# Patient Record
Sex: Female | Born: 1975 | Race: White | Hispanic: No | Marital: Single | Smoking: Current every day smoker
Health system: Southern US, Community
[De-identification: ages and names within clinical notes are randomized; demographics above are authoritative.]

## PROBLEM LIST (undated history)

## (undated) DIAGNOSIS — F419 Anxiety disorder, unspecified: Secondary | ICD-10-CM

## (undated) DIAGNOSIS — F32A Depression, unspecified: Secondary | ICD-10-CM

## (undated) DIAGNOSIS — IMO0001 Reserved for inherently not codable concepts without codable children: Secondary | ICD-10-CM

## (undated) DIAGNOSIS — J45909 Unspecified asthma, uncomplicated: Secondary | ICD-10-CM

## (undated) DIAGNOSIS — K219 Gastro-esophageal reflux disease without esophagitis: Secondary | ICD-10-CM

## (undated) DIAGNOSIS — R519 Headache, unspecified: Secondary | ICD-10-CM

## (undated) DIAGNOSIS — G4730 Sleep apnea, unspecified: Secondary | ICD-10-CM

## (undated) DIAGNOSIS — F329 Major depressive disorder, single episode, unspecified: Secondary | ICD-10-CM

## (undated) DIAGNOSIS — J449 Chronic obstructive pulmonary disease, unspecified: Secondary | ICD-10-CM

## (undated) DIAGNOSIS — R51 Headache: Secondary | ICD-10-CM

## (undated) HISTORY — PX: OTHER SURGICAL HISTORY: SHX169

## (undated) HISTORY — PX: ABDOMINAL HYSTERECTOMY: SHX81

---

## 1997-07-26 HISTORY — PX: TUBAL LIGATION: SHX77

## 2005-03-11 ENCOUNTER — Emergency Department: Payer: Self-pay | Admitting: Emergency Medicine

## 2005-03-11 ENCOUNTER — Other Ambulatory Visit: Payer: Self-pay

## 2006-07-12 ENCOUNTER — Emergency Department: Payer: Self-pay | Admitting: Emergency Medicine

## 2006-08-31 ENCOUNTER — Emergency Department: Payer: Self-pay | Admitting: Unknown Physician Specialty

## 2006-08-31 ENCOUNTER — Other Ambulatory Visit: Payer: Self-pay

## 2007-04-16 ENCOUNTER — Emergency Department: Payer: Self-pay | Admitting: Emergency Medicine

## 2009-02-26 ENCOUNTER — Ambulatory Visit: Payer: Self-pay | Admitting: Internal Medicine

## 2009-05-08 ENCOUNTER — Ambulatory Visit: Payer: Self-pay | Admitting: Gastroenterology

## 2009-07-28 ENCOUNTER — Emergency Department: Payer: Self-pay | Admitting: Emergency Medicine

## 2009-09-16 ENCOUNTER — Ambulatory Visit: Payer: Self-pay | Admitting: Internal Medicine

## 2009-11-19 ENCOUNTER — Ambulatory Visit: Payer: Self-pay | Admitting: Internal Medicine

## 2010-04-14 ENCOUNTER — Ambulatory Visit: Payer: Self-pay | Admitting: Internal Medicine

## 2010-04-15 ENCOUNTER — Ambulatory Visit: Payer: Self-pay | Admitting: Internal Medicine

## 2010-05-13 ENCOUNTER — Ambulatory Visit: Payer: Self-pay | Admitting: Gastroenterology

## 2010-05-29 ENCOUNTER — Ambulatory Visit: Payer: Self-pay

## 2011-01-21 ENCOUNTER — Emergency Department: Payer: Self-pay | Admitting: Emergency Medicine

## 2011-02-11 ENCOUNTER — Ambulatory Visit: Payer: Self-pay | Admitting: Internal Medicine

## 2011-03-25 ENCOUNTER — Ambulatory Visit: Payer: Self-pay | Admitting: Obstetrics & Gynecology

## 2011-03-27 ENCOUNTER — Emergency Department: Payer: Self-pay | Admitting: Emergency Medicine

## 2011-04-02 ENCOUNTER — Ambulatory Visit: Payer: Self-pay | Admitting: Obstetrics & Gynecology

## 2011-08-17 ENCOUNTER — Ambulatory Visit: Payer: Self-pay | Admitting: Internal Medicine

## 2012-02-17 ENCOUNTER — Ambulatory Visit: Payer: Self-pay

## 2012-08-03 ENCOUNTER — Emergency Department: Payer: Self-pay | Admitting: Unknown Physician Specialty

## 2012-08-03 LAB — CBC
HCT: 38.1 % (ref 35.0–47.0)
HGB: 13.1 g/dL (ref 12.0–16.0)
MCH: 32.8 pg (ref 26.0–34.0)
MCHC: 34.4 g/dL (ref 32.0–36.0)
MCV: 95 fL (ref 80–100)
Platelet: 234 10*3/uL (ref 150–440)
RBC: 4 10*6/uL (ref 3.80–5.20)

## 2012-08-03 LAB — BASIC METABOLIC PANEL
Anion Gap: 4 — ABNORMAL LOW (ref 7–16)
Calcium, Total: 8.9 mg/dL (ref 8.5–10.1)
Chloride: 103 mmol/L (ref 98–107)
Creatinine: 0.74 mg/dL (ref 0.60–1.30)
EGFR (African American): >60
EGFR (Non-African Amer.): >60
Osmolality: 275 (ref 275–301)
Potassium: 3.6 mmol/L (ref 3.5–5.1)
Sodium: 138 mmol/L (ref 136–145)

## 2012-08-11 ENCOUNTER — Ambulatory Visit: Payer: Self-pay | Admitting: Internal Medicine

## 2012-08-15 ENCOUNTER — Ambulatory Visit: Payer: Self-pay | Admitting: Physician Assistant

## 2013-10-15 ENCOUNTER — Ambulatory Visit: Payer: Self-pay

## 2013-10-23 ENCOUNTER — Ambulatory Visit: Payer: Self-pay | Admitting: Internal Medicine

## 2013-11-28 ENCOUNTER — Ambulatory Visit: Payer: Self-pay | Admitting: Internal Medicine

## 2013-11-29 ENCOUNTER — Ambulatory Visit: Payer: Self-pay | Admitting: Internal Medicine

## 2014-01-05 ENCOUNTER — Emergency Department: Payer: Self-pay | Admitting: Emergency Medicine

## 2014-04-11 ENCOUNTER — Ambulatory Visit: Payer: Self-pay

## 2014-04-30 ENCOUNTER — Ambulatory Visit: Payer: Self-pay | Admitting: Internal Medicine

## 2014-05-02 LAB — BRONCHIAL WASH CULTURE

## 2014-05-14 ENCOUNTER — Ambulatory Visit: Payer: Self-pay | Admitting: Internal Medicine

## 2014-06-03 LAB — CULTURE, FUNGUS WITHOUT SMEAR

## 2014-08-28 ENCOUNTER — Ambulatory Visit: Payer: Self-pay | Admitting: Anesthesiology

## 2014-08-28 LAB — BASIC METABOLIC PANEL
ANION GAP: 7 (ref 7–16)
BUN: 8 mg/dL (ref 7–18)
CALCIUM: 8.5 mg/dL (ref 8.5–10.1)
Chloride: 107 mmol/L (ref 98–107)
Co2: 26 mmol/L (ref 21–32)
Creatinine: 0.73 mg/dL (ref 0.60–1.30)
EGFR (African American): >60
EGFR (Non-African Amer.): >60
GLUCOSE: 77 mg/dL (ref 65–99)
OSMOLALITY: 277 (ref 275–301)
Potassium: 3.4 mmol/L — ABNORMAL LOW (ref 3.5–5.1)
Sodium: 140 mmol/L (ref 136–145)

## 2014-08-28 LAB — CBC WITH DIFFERENTIAL/PLATELET
BASOS PCT: 0.8 %
Basophil #: 0.1 10*3/uL (ref 0.0–0.1)
EOS PCT: 0.7 %
Eosinophil #: 0.1 10*3/uL (ref 0.0–0.7)
HCT: 36.1 % (ref 35.0–47.0)
HGB: 12 g/dL (ref 12.0–16.0)
LYMPHS ABS: 1.8 10*3/uL (ref 1.0–3.6)
Lymphocyte %: 14 %
MCH: 31.4 pg (ref 26.0–34.0)
MCHC: 33.3 g/dL (ref 32.0–36.0)
MCV: 94 fL (ref 80–100)
MONOS PCT: 6 %
Monocyte #: 0.8 x10 3/mm (ref 0.2–0.9)
NEUTROS ABS: 10 10*3/uL — AB (ref 1.4–6.5)
Neutrophil %: 78.5 %
Platelet: 232 10*3/uL (ref 150–440)
RBC: 3.84 10*6/uL (ref 3.80–5.20)
RDW: 12.7 % (ref 11.5–14.5)
WBC: 12.7 10*3/uL — AB (ref 3.6–11.0)

## 2014-09-10 ENCOUNTER — Ambulatory Visit: Payer: Self-pay | Admitting: Specialist

## 2014-10-02 ENCOUNTER — Emergency Department: Payer: Self-pay | Admitting: Emergency Medicine

## 2014-11-18 LAB — SURGICAL PATHOLOGY

## 2014-11-24 NOTE — Op Note (Signed)
PATIENT NAME:  Joan Bass, Joan Bass MR#:  [id number] DATE OF BIRTH:  October 10, 1975  DATE OF PROCEDURE:  09/10/2014  PREOPERATIVE DIAGNOSIS: Painful left olecranon bursa.   POSTOPERATIVE DIAGNOSIS: Painful left olecranon bursa.   PROCEDURE: Excision of left elbow olecranon bursa.   SURGEON: Lucas Mallow, MD   ANESTHESIA: General.   COMPLICATIONS: None.   TOURNIQUET TIME: Approximately 50 minutes.   DESCRIPTION OF PROCEDURE: After adequate induction of general anesthesia, the left upper extremity is thoroughly prepped with alcohol and ChloraPrep and draped in standard sterile fashion. The extremity is wrapped out with the Esmarch bandage and pneumatic tourniquet elevated to 250 mmHg. A curved lateral incision is made over the bursa, and the dissection is carried down to the bursa sac. This is completely dissected out without removing the fluid. Careful respect is paid to the ulnar nerve medially. The complete bursa sac is removed under direct vision. The rongeur is used to remove any extra tissue, which is suspicious. The wound is thoroughly irrigated multiple times. Two large vessel loop drains are brought out through separate stab wound incisions. Soft bulky dressing is applied, along with a sling. The patient is returned to the recovery room in satisfactory condition, having tolerated the procedure quite well.    ____________________________ Lucas Mallow, MD ces:mw D: 09/10/2014 10:04:08 ET T: 09/10/2014 11:23:46 ET JOB#: [id number]  cc: Lucas Mallow, MD, <Dictator> Lucas Mallow MD ELECTRONICALLY SIGNED 09/10/2014 16:55

## 2014-12-13 ENCOUNTER — Ambulatory Visit: Payer: Medicaid Other | Admitting: Anesthesiology

## 2014-12-13 ENCOUNTER — Encounter
Admission: RE | Disposition: A | Discharge status: Home / Self Care | Payer: Self-pay | Source: Ambulatory Visit | Attending: Gastroenterology

## 2014-12-13 ENCOUNTER — Encounter: Payer: Self-pay | Admitting: *Deleted

## 2014-12-13 ENCOUNTER — Ambulatory Visit
Admission: RE | Admit: 2014-12-13 | Discharge: 2014-12-13 | Disposition: A | Discharge status: Home / Self Care | Payer: Medicaid Other | Source: Ambulatory Visit | Attending: Gastroenterology | Admitting: Gastroenterology

## 2014-12-13 DIAGNOSIS — Z8371 Family history of colonic polyps: Secondary | ICD-10-CM | POA: Diagnosis present

## 2014-12-13 DIAGNOSIS — F329 Major depressive disorder, single episode, unspecified: Secondary | ICD-10-CM | POA: Insufficient documentation

## 2014-12-13 DIAGNOSIS — K5909 Other constipation: Secondary | ICD-10-CM | POA: Insufficient documentation

## 2014-12-13 DIAGNOSIS — Z7951 Long term (current) use of inhaled steroids: Secondary | ICD-10-CM | POA: Insufficient documentation

## 2014-12-13 DIAGNOSIS — J449 Chronic obstructive pulmonary disease, unspecified: Secondary | ICD-10-CM | POA: Insufficient documentation

## 2014-12-13 DIAGNOSIS — Z9889 Other specified postprocedural states: Secondary | ICD-10-CM | POA: Insufficient documentation

## 2014-12-13 DIAGNOSIS — Z803 Family history of malignant neoplasm of breast: Secondary | ICD-10-CM | POA: Insufficient documentation

## 2014-12-13 DIAGNOSIS — G4730 Sleep apnea, unspecified: Secondary | ICD-10-CM | POA: Diagnosis not present

## 2014-12-13 DIAGNOSIS — F419 Anxiety disorder, unspecified: Secondary | ICD-10-CM | POA: Diagnosis not present

## 2014-12-13 DIAGNOSIS — Z1211 Encounter for screening for malignant neoplasm of colon: Secondary | ICD-10-CM | POA: Diagnosis not present

## 2014-12-13 DIAGNOSIS — R1310 Dysphagia, unspecified: Secondary | ICD-10-CM | POA: Diagnosis present

## 2014-12-13 DIAGNOSIS — Z79899 Other long term (current) drug therapy: Secondary | ICD-10-CM | POA: Insufficient documentation

## 2014-12-13 DIAGNOSIS — F4310 Post-traumatic stress disorder, unspecified: Secondary | ICD-10-CM | POA: Diagnosis not present

## 2014-12-13 DIAGNOSIS — Z808 Family history of malignant neoplasm of other organs or systems: Secondary | ICD-10-CM | POA: Diagnosis not present

## 2014-12-13 DIAGNOSIS — G4700 Insomnia, unspecified: Secondary | ICD-10-CM | POA: Insufficient documentation

## 2014-12-13 DIAGNOSIS — F17210 Nicotine dependence, cigarettes, uncomplicated: Secondary | ICD-10-CM | POA: Diagnosis not present

## 2014-12-13 DIAGNOSIS — R042 Hemoptysis: Secondary | ICD-10-CM | POA: Insufficient documentation

## 2014-12-13 DIAGNOSIS — Z8269 Family history of other diseases of the musculoskeletal system and connective tissue: Secondary | ICD-10-CM | POA: Diagnosis not present

## 2014-12-13 DIAGNOSIS — G2581 Restless legs syndrome: Secondary | ICD-10-CM | POA: Diagnosis not present

## 2014-12-13 HISTORY — DX: Chronic obstructive pulmonary disease, unspecified: J44.9

## 2014-12-13 HISTORY — PX: ESOPHAGOGASTRODUODENOSCOPY: SHX5428

## 2014-12-13 HISTORY — DX: Reserved for inherently not codable concepts without codable children: IMO0001

## 2014-12-13 HISTORY — DX: Major depressive disorder, single episode, unspecified: F32.9

## 2014-12-13 HISTORY — DX: Anxiety disorder, unspecified: F41.9

## 2014-12-13 HISTORY — DX: Depression, unspecified: F32.A

## 2014-12-13 HISTORY — DX: Headache: R51

## 2014-12-13 HISTORY — DX: Gastro-esophageal reflux disease without esophagitis: K21.9

## 2014-12-13 HISTORY — PX: COLONOSCOPY WITH PROPOFOL: SHX5780

## 2014-12-13 HISTORY — DX: Sleep apnea, unspecified: G47.30

## 2014-12-13 HISTORY — DX: Unspecified asthma, uncomplicated: J45.909

## 2014-12-13 HISTORY — DX: Headache, unspecified: R51.9

## 2014-12-13 SURGERY — COLONOSCOPY WITH PROPOFOL
Anesthesia: General

## 2014-12-13 MED ORDER — LIDOCAINE HCL (CARDIAC) 20 MG/ML IV SOLN
INTRAVENOUS | Status: DC | PRN
  Administered 2014-12-13: 50 mg via INTRAVENOUS

## 2014-12-13 MED ORDER — PROPOFOL INFUSION 10 MG/ML OPTIME
INTRAVENOUS | Status: DC | PRN
Start: 2014-12-13 — End: 2014-12-13
  Administered 2014-12-13: 140 ug/kg/min via INTRAVENOUS

## 2014-12-13 MED ORDER — FENTANYL CITRATE (PF) 100 MCG/2ML IJ SOLN
INTRAMUSCULAR | Status: DC | PRN
  Administered 2014-12-13: 50 ug via INTRAVENOUS

## 2014-12-13 MED ORDER — MIDAZOLAM HCL 5 MG/5ML IJ SOLN
INTRAMUSCULAR | Status: DC | PRN
  Administered 2014-12-13: 1 mg via INTRAVENOUS

## 2014-12-13 MED ORDER — GLYCOPYRROLATE 0.2 MG/ML IJ SOLN
INTRAMUSCULAR | Status: DC | PRN
  Administered 2014-12-13: 0.2 mg via INTRAVENOUS

## 2014-12-13 MED ORDER — PROPOFOL 10 MG/ML IV BOLUS
INTRAVENOUS | Status: DC | PRN
  Administered 2014-12-13: 50 mg via INTRAVENOUS
  Administered 2014-12-13: 70 mg via INTRAVENOUS
  Administered 2014-12-13: 30 mg via INTRAVENOUS
  Administered 2014-12-13: 50 mg via INTRAVENOUS

## 2014-12-13 MED ORDER — SODIUM CHLORIDE 0.9 % IV SOLN
INTRAVENOUS | Status: DC
  Administered 2014-12-13: 1000 mL via INTRAVENOUS

## 2014-12-13 NOTE — Transfer of Care (Signed)
Immediate Anesthesia Transfer of Care Note  Patient: Joan Bass  Procedure(s) Performed: Procedure(s): COLONOSCOPY WITH PROPOFOL (N/A) ESOPHAGOGASTRODUODENOSCOPY (EGD) (N/A)  Patient Location: PACU and Endoscopy Unit  Anesthesia Type:General  Level of Consciousness: awake and alert   Airway & Oxygen Therapy: Patient Spontanous Breathing and Patient connected to nasal cannula oxygen  Post-op Assessment: Report given to RN and Post -op Vital signs reviewed and stable  Post vital signs: Reviewed  Last Vitals:  Filed Vitals:   12/13/14 1125  BP: 107/67  Pulse: 93  Temp: 36.3 C  Resp: 18    Complications: No apparent anesthesia complications

## 2014-12-13 NOTE — Anesthesia Postprocedure Evaluation (Signed)
  Anesthesia Post-op Note  Patient: Joan Bass  Procedure(s) Performed: Procedure(s): COLONOSCOPY WITH PROPOFOL (N/A) ESOPHAGOGASTRODUODENOSCOPY (EGD) (N/A)  Anesthesia type:General  Patient location: PACU  Post pain: Pain level controlled  Post assessment: Post-op Vital signs reviewed, Patient's Cardiovascular Status Stable, Respiratory Function Stable, Patent Airway and No signs of Nausea or vomiting  Post vital signs: Reviewed and stable  Last Vitals:  Filed Vitals:   12/13/14 1128  BP: 107/67  Pulse: 90  Temp: 36.2 C  Resp: 17    Level of consciousness: awake, alert  and patient cooperative  Complications: No apparent anesthesia complications

## 2014-12-13 NOTE — Op Note (Signed)
[facility] Gastroenterology Patient Name: Joan Bass Procedure Date: 12/13/2014 10:47 AM MRN: [id number] Account #: [id number] Date of Birth: 18-Nov-1975 Admit Type: Outpatient Age: 39 Room: [facility] ENDO ROOM 4 Gender: Female Note Status: Finalized Procedure:         Upper GI endoscopy Indications:       Dysphagia Providers:         Lupita Dawn. Candace Cruise, MD Referring MD:      Lavera Guise, MD (Referring MD) Medicines:         Monitored Anesthesia Care Complications:     No immediate complications. Procedure:         Pre-Anesthesia Assessment:                    - Prior to the procedure, a History and Physical was                     performed, and patient medications, allergies and                     sensitivities were reviewed. The patient's tolerance of                     previous anesthesia was reviewed.                    - The risks and benefits of the procedure and the sedation                     options and risks were discussed with the patient. All                     questions were answered and informed consent was obtained.                    - After reviewing the risks and benefits, the patient was                     deemed in satisfactory condition to undergo the procedure.                    After obtaining informed consent, the endoscope was passed                     under direct vision. Throughout the procedure, the                     patient's blood pressure, pulse, and oxygen saturations                     were monitored continuously. The Olympus GIF-160 endoscope                     (S#. [id number]) was introduced through the mouth, and                     advanced to the second part of duodenum. The upper GI                     endoscopy was accomplished without difficulty. The patient                     tolerated the procedure well. Findings:      No endoscopic abnormality was evident in the esophagus to explain the  patient's complaint of  dysphagia. It was decided, however, to proceed       with dilation of the entire esophagus. The scope was withdrawn. Dilation       was performed with a Maloney dilator with mild resistance at 23 Fr.      The entire examined stomach was normal.      The examined duodenum was normal. Impression:        - No endoscopic esophageal abnormality to explain                     patient's dysphagia. Esophagus dilated. Dilated.                    - Normal stomach.                    - Normal examined duodenum.                    - No specimens collected. Recommendation:    - Discharge patient to home.                    - Observe patient's clinical course.                    - The findings and recommendations were discussed with the                     patient. Procedure Code(s): --- Professional ---                    [phone], Esophagogastroduodenoscopy, flexible, transoral;                     diagnostic, including collection of specimen(s) by                     brushing or washing, when performed (separate procedure)                    43450, Dilation of esophagus, by unguided sound or bougie,                     single or multiple passes Diagnosis Code(s): --- Professional ---                    R13.10, Dysphagia, unspecified CPT copyright 2014 American Medical Association. All rights reserved. The codes documented in this report are preliminary and upon coder review may  be revised to meet current compliance requirements. Hulen Luster, MD 12/13/2014 11:04:05 AM This report has been signed electronically. Number of Addenda: 0 Note Initiated On: 12/13/2014 10:47 AM      [facility]

## 2014-12-13 NOTE — H&P (Signed)
  Date of Initial H&P:12/09/2014  History reviewed, patient examined, no change in status, stable for surgery.

## 2014-12-13 NOTE — Anesthesia Preprocedure Evaluation (Addendum)
Anesthesia Evaluation  Patient identified by MRN, date of birth, ID band Patient awake    Reviewed: Allergy & Precautions, NPO status , Patient's Chart, lab work & pertinent test results  Airway Mallampati: II  TM Distance: >3 FB Neck ROM: Full    Dental  (+) Chipped   Pulmonary asthma , COPD (last inhaler yesterday/ 2 L O2 at night) COPD inhaler and oxygen dependent, Current Smoker (1 ppd),          Cardiovascular     Neuro/Psych  Headaches,    GI/Hepatic GERD-  Medicated and Poorly Controlled,  Endo/Other    Renal/GU      Musculoskeletal   Abdominal   Peds  Hematology   Anesthesia Other Findings   Reproductive/Obstetrics                            Anesthesia Physical Anesthesia Plan  ASA: III  Anesthesia Plan: General   Post-op Pain Management:    Induction: Intravenous  Airway Management Planned: Nasal Cannula  Additional Equipment:   Intra-op Plan:   Post-operative Plan:   Informed Consent: I have reviewed the patients History and Physical, chart, labs and discussed the procedure including the risks, benefits and alternatives for the proposed anesthesia with the patient or authorized representative who has indicated his/her understanding and acceptance.     Plan Discussed with:   Anesthesia Plan Comments:         Anesthesia Quick Evaluation

## 2014-12-13 NOTE — Op Note (Signed)
[facility] Gastroenterology Patient Name: Joan Bass Procedure Date: 12/13/2014 10:46 AM MRN: [id number] Account #: [id number] Date of Birth: 1976-02-14 Admit Type: Outpatient Age: 39 Room: [facility] ENDO ROOM 4 Gender: Female Note Status: Finalized Procedure:         Colonoscopy Indications:       Colon cancer screening in patient at increased risk:                     Family history of colon polyps Providers:         Lupita Dawn. Candace Cruise, MD Medicines:         Monitored Anesthesia Care Complications:     No immediate complications. Procedure:         Pre-Anesthesia Assessment:                    - Prior to the procedure, a History and Physical was                     performed, and patient medications, allergies and                     sensitivities were reviewed. The patient's tolerance of                     previous anesthesia was reviewed.                    - The risks and benefits of the procedure and the sedation                     options and risks were discussed with the patient. All                     questions were answered and informed consent was obtained.                    - After reviewing the risks and benefits, the patient was                     deemed in satisfactory condition to undergo the procedure.                    After obtaining informed consent, the colonoscope was                     passed under direct vision. Throughout the procedure, the                     patient's blood pressure, pulse, and oxygen saturations                     were monitored continuously. The Colonoscope was                     introduced through the anus and advanced to the the cecum,                     identified by appendiceal orifice and ileocecal valve. The                     colonoscopy was performed without difficulty. The patient                     tolerated the procedure well. The quality  of the bowel                     preparation was poor. Findings:    The colon (entire examined portion) appeared normal. Poor prep.       Difficult to visualize mucosa despite lavage and suctioning. Impression:        - Preparation of the colon was poor.                    - The entire examined colon is normal.                    - No specimens collected. Recommendation:    - Discharge patient to home.                    - Repeat colonoscopy in 5 years for surveillance.                    - The findings and recommendations were discussed with the                     patient. Procedure Code(s): --- Professional ---                    [phone], Colonoscopy, flexible; diagnostic, including                     collection of specimen(s) by brushing or washing, when                     performed (separate procedure) Diagnosis Code(s): --- Professional ---                    Z83.71, Family history of colonic polyps CPT copyright 2014 American Medical Association. All rights reserved. The codes documented in this report are preliminary and upon coder review may  be revised to meet current compliance requirements. Hulen Luster, MD 12/13/2014 11:23:37 AM This report has been signed electronically. Number of Addenda: 0 Note Initiated On: 12/13/2014 10:46 AM Scope Withdrawal Time: 0 hours 10 minutes 11 seconds  Total Procedure Duration: 0 hours 16 minutes 23 seconds       [facility]

## 2014-12-17 ENCOUNTER — Encounter: Payer: Self-pay | Admitting: Gastroenterology

## 2014-12-24 ENCOUNTER — Ambulatory Visit
Admission: RE | Admit: 2014-12-24 | Discharge: 2014-12-24 | Disposition: A | Discharge status: Home / Self Care | Payer: Medicaid Other | Source: Ambulatory Visit | Attending: Internal Medicine | Admitting: Internal Medicine

## 2014-12-24 ENCOUNTER — Other Ambulatory Visit: Payer: Self-pay | Admitting: Internal Medicine

## 2014-12-24 DIAGNOSIS — R059 Cough, unspecified: Secondary | ICD-10-CM

## 2014-12-24 DIAGNOSIS — R05 Cough: Secondary | ICD-10-CM

## 2015-03-21 ENCOUNTER — Ambulatory Visit
Admission: RE | Admit: 2015-03-21 | Discharge: 2015-03-21 | Disposition: A | Discharge status: Home / Self Care | Payer: Medicaid Other | Source: Ambulatory Visit | Attending: Physician Assistant | Admitting: Physician Assistant

## 2015-03-21 ENCOUNTER — Other Ambulatory Visit: Payer: Self-pay | Admitting: Physician Assistant

## 2015-03-21 DIAGNOSIS — R0602 Shortness of breath: Secondary | ICD-10-CM | POA: Diagnosis not present

## 2015-03-25 ENCOUNTER — Other Ambulatory Visit: Payer: Self-pay | Admitting: Physician Assistant

## 2015-03-25 DIAGNOSIS — R0602 Shortness of breath: Secondary | ICD-10-CM

## 2015-03-26 ENCOUNTER — Ambulatory Visit
Admission: RE | Admit: 2015-03-26 | Discharge: 2015-03-26 | Disposition: A | Discharge status: Home / Self Care | Payer: Medicaid Other | Source: Ambulatory Visit | Attending: Physician Assistant | Admitting: Physician Assistant

## 2015-03-26 DIAGNOSIS — R0602 Shortness of breath: Secondary | ICD-10-CM | POA: Insufficient documentation

## 2015-05-01 ENCOUNTER — Other Ambulatory Visit: Payer: Self-pay | Admitting: Physician Assistant

## 2015-05-02 ENCOUNTER — Other Ambulatory Visit: Payer: Self-pay | Admitting: Physician Assistant

## 2015-05-02 DIAGNOSIS — N63 Unspecified lump in unspecified breast: Secondary | ICD-10-CM

## 2015-05-08 ENCOUNTER — Ambulatory Visit
Admission: RE | Admit: 2015-05-08 | Discharge: 2015-05-08 | Disposition: A | Discharge status: Home / Self Care | Payer: Medicaid Other | Source: Ambulatory Visit | Attending: Physician Assistant | Admitting: Physician Assistant

## 2015-05-08 DIAGNOSIS — N63 Unspecified lump in unspecified breast: Secondary | ICD-10-CM

## 2015-05-13 ENCOUNTER — Other Ambulatory Visit: Payer: Self-pay | Admitting: Physician Assistant

## 2015-05-13 DIAGNOSIS — R928 Other abnormal and inconclusive findings on diagnostic imaging of breast: Secondary | ICD-10-CM

## 2015-05-16 ENCOUNTER — Ambulatory Visit
Admission: RE | Admit: 2015-05-16 | Discharge: 2015-05-16 | Disposition: A | Discharge status: Home / Self Care | Payer: Medicaid Other | Source: Ambulatory Visit | Attending: Physician Assistant | Admitting: Physician Assistant

## 2015-05-16 DIAGNOSIS — N63 Unspecified lump in breast: Secondary | ICD-10-CM | POA: Diagnosis present

## 2015-05-16 DIAGNOSIS — R928 Other abnormal and inconclusive findings on diagnostic imaging of breast: Secondary | ICD-10-CM

## 2015-05-16 DIAGNOSIS — D241 Benign neoplasm of right breast: Secondary | ICD-10-CM | POA: Insufficient documentation

## 2015-05-19 LAB — SURGICAL PATHOLOGY

## 2015-05-21 HISTORY — PX: BREAST BIOPSY: SHX20

## 2015-05-23 ENCOUNTER — Ambulatory Visit: Payer: Medicaid Other

## 2015-06-09 IMAGING — CR DG CHEST 2V
1 series · 2 of 2 positions shown · non-contrast
Comparison: CT 11/28/2013.  Chest x-ray 08/03/2012.

CLINICAL DATA: COPD.  Shortness of breath.

EXAM:
CHEST  2 VIEW

[Series 1: w chest pa · 0.14mm/px · 2 of 2 slices shown]
[im 1/2]
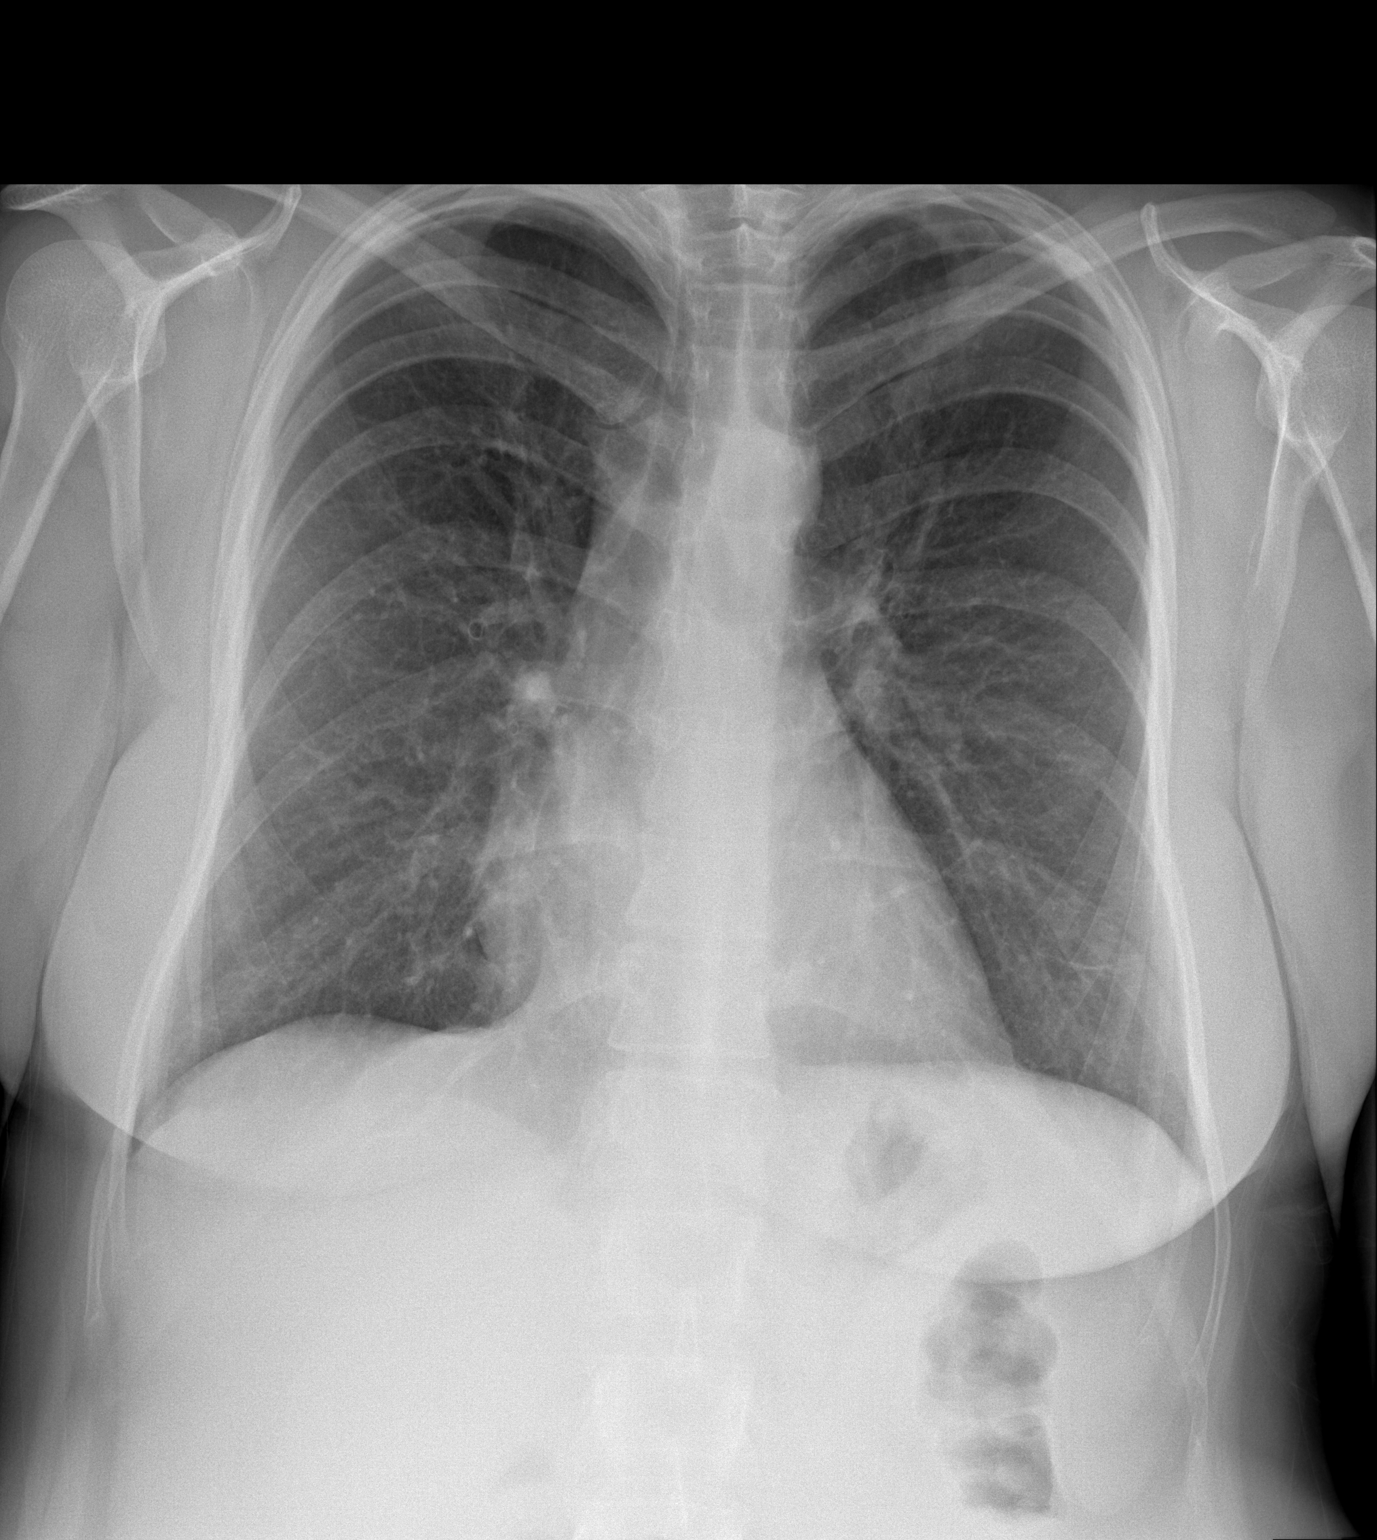
[im 2/2]
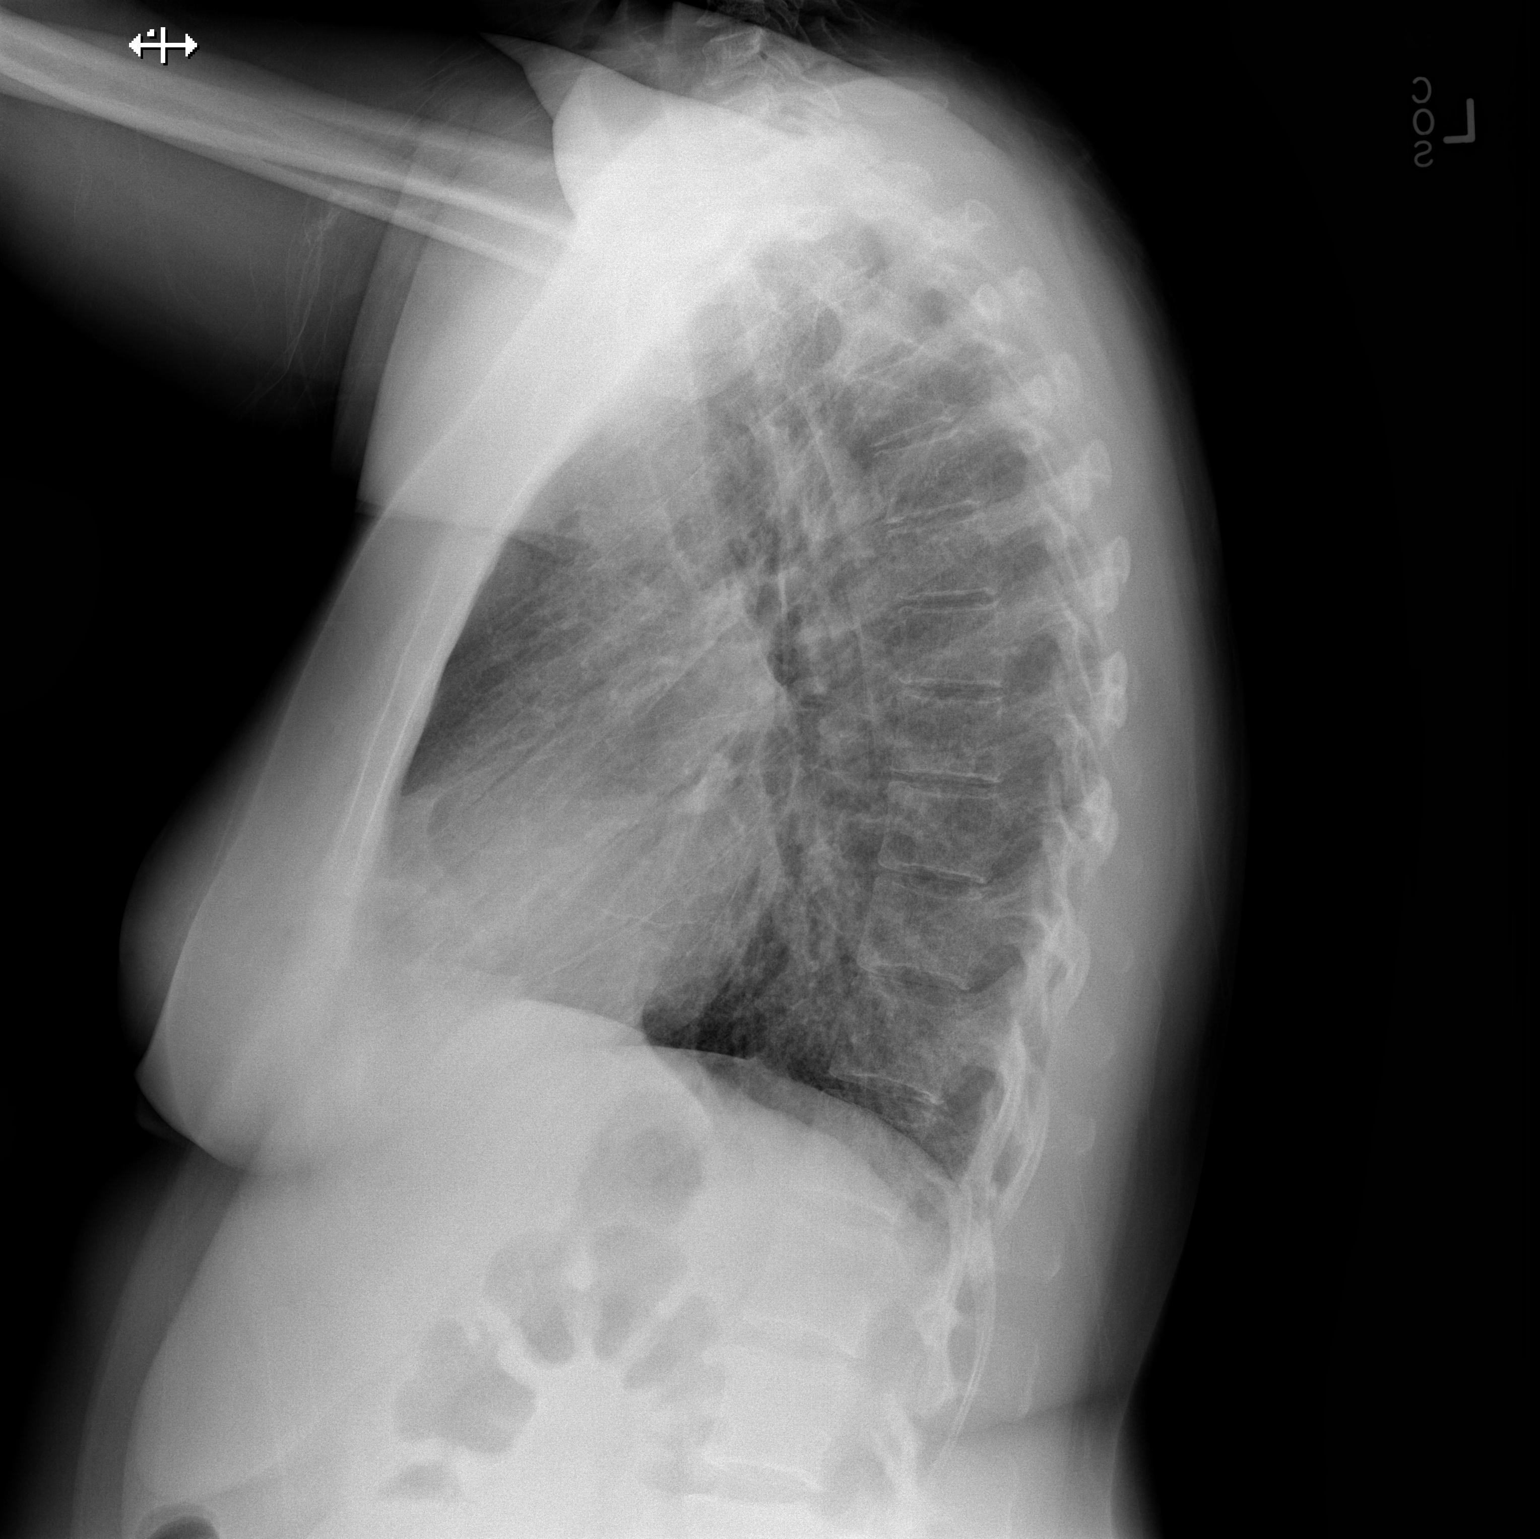

[2 of 2 positions shown; findings below may reference images not displayed]

FINDINGS: Mediastinum and hilar structures normal. The lungs are clear of
acute infiltrates. Bibasilar pleural parenchymal thickening noted
consistent with scarring. No pleural effusion or pneumothorax. Heart
size normal. No acute bony abnormality identified.
IMPRESSION: No acute cardiopulmonary disease.

## 2015-06-17 ENCOUNTER — Ambulatory Visit
Admission: RE | Admit: 2015-06-17 | Discharge: 2015-06-17 | Disposition: A | Discharge status: Home / Self Care | Payer: Medicaid Other | Source: Ambulatory Visit | Attending: Physician Assistant | Admitting: Physician Assistant

## 2015-06-17 ENCOUNTER — Other Ambulatory Visit: Payer: Self-pay | Admitting: Physician Assistant

## 2015-06-17 DIAGNOSIS — R042 Hemoptysis: Secondary | ICD-10-CM | POA: Diagnosis present

## 2015-06-17 DIAGNOSIS — R05 Cough: Secondary | ICD-10-CM

## 2015-06-17 DIAGNOSIS — R059 Cough, unspecified: Secondary | ICD-10-CM

## 2015-07-14 IMAGING — CR DG CHEST 1V PORT
1 series · 1 of 1 positions shown · non-contrast
Comparison: 08/28/2014

CLINICAL DATA: Left side chest pain

EXAM:
PORTABLE CHEST - 1 VIEW

[ap]
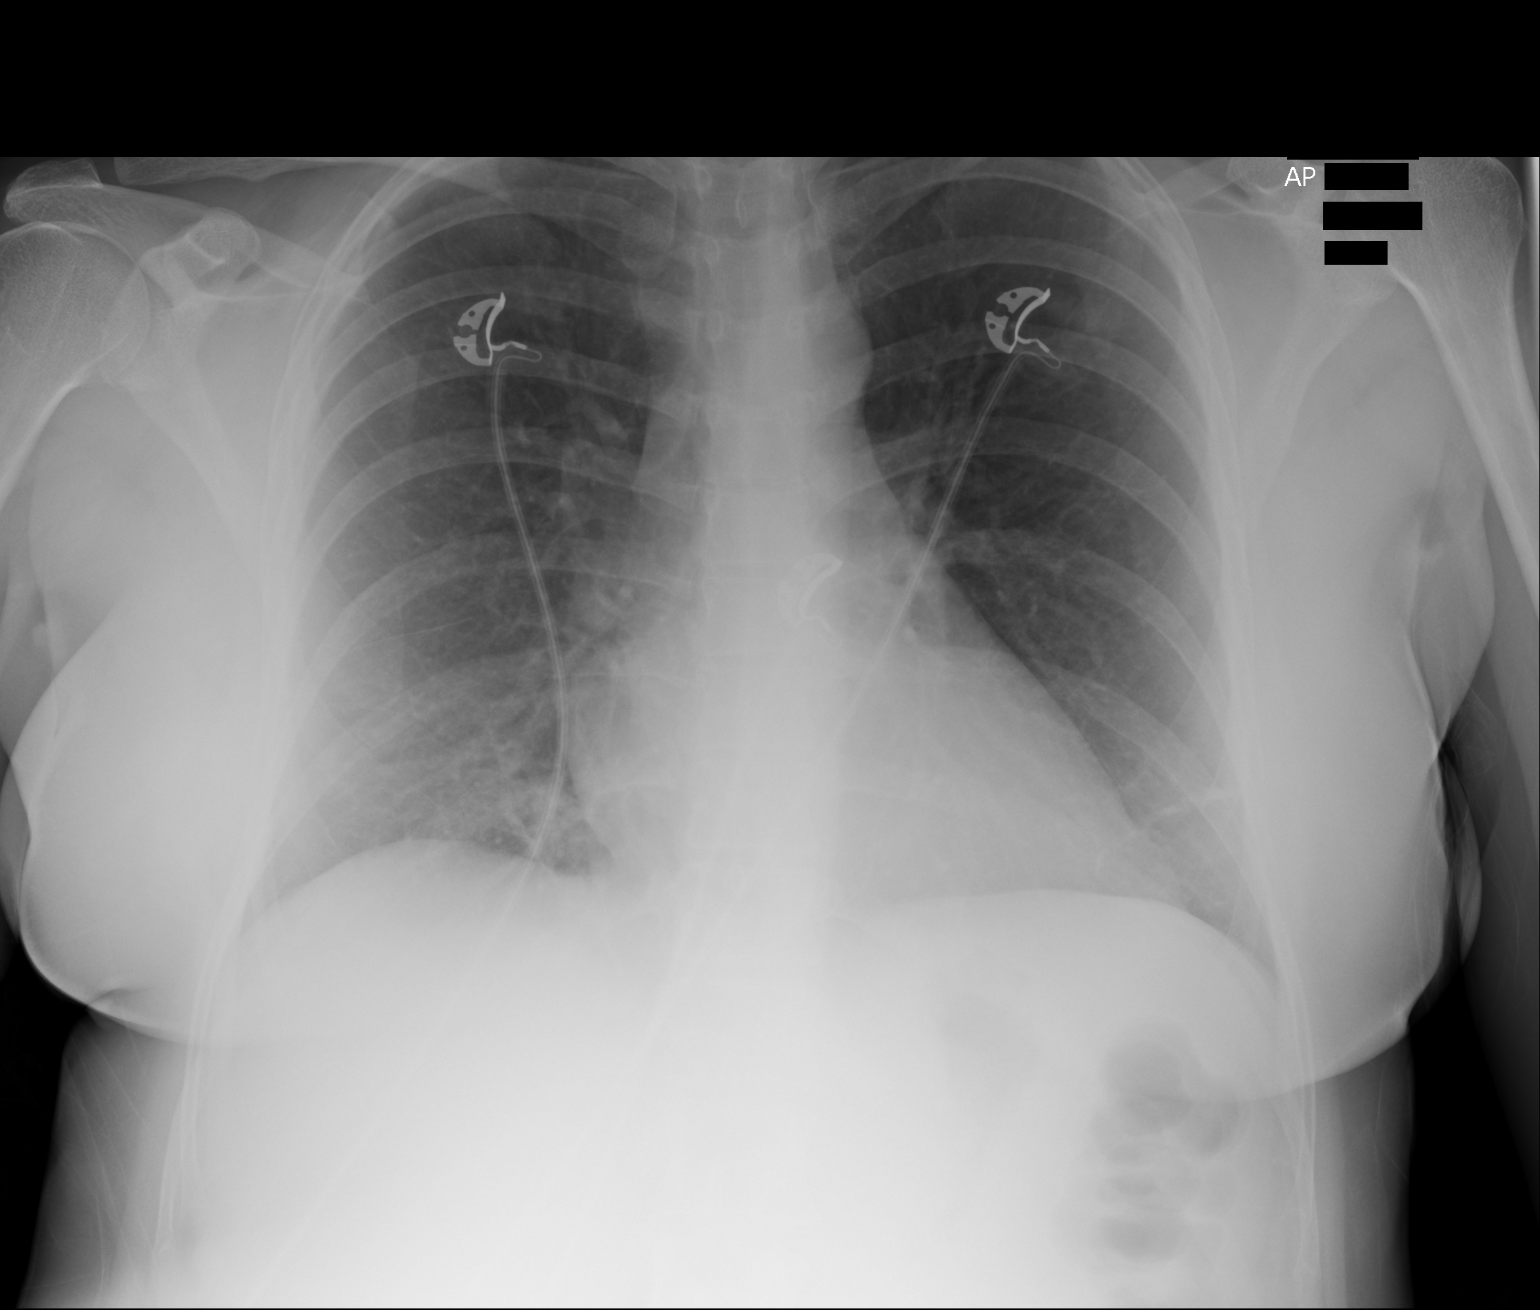

[1 of 1 positions shown; findings below may reference images not displayed]

FINDINGS: Cardiomediastinal silhouette is stable. No acute infiltrate or
pleural effusion. No pulmonary edema. There is linear atelectasis or
scarring in lingula.
IMPRESSION: No acute infiltrate or pulmonary edema. Linear atelectasis or
scarring in lingula.

## 2015-10-05 IMAGING — CR DG CHEST 2V
1 series · 2 of 2 positions shown · non-contrast
Comparison: Prior chest x-ray 10/02/2014 ; prior chest CT
11/28/2013

CLINICAL DATA: 39-year-old female with hemoptysis for the past
week.

EXAM:
CHEST  2 VIEW

[Series 1: dg chest 2 view · 0.14mm/px · 2 of 2 slices shown]
[im 1/2]
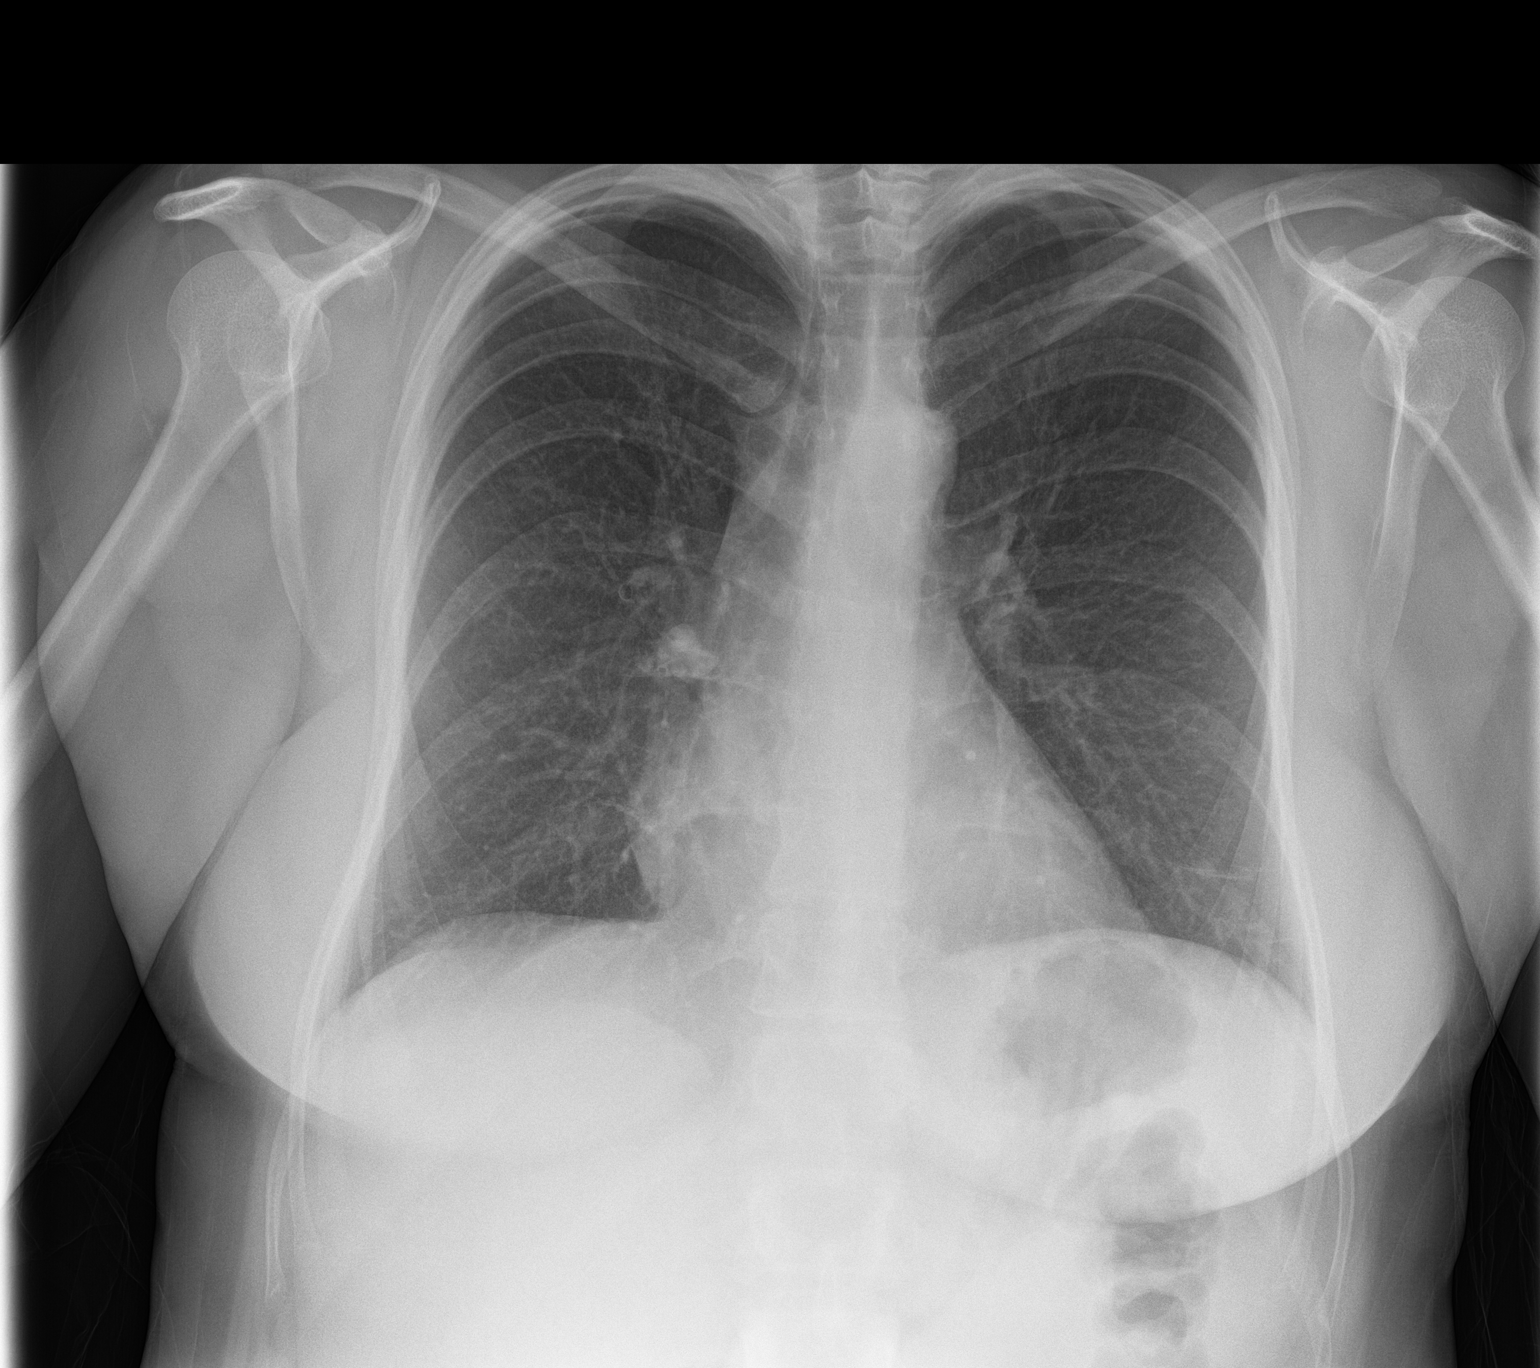
[im 2/2]
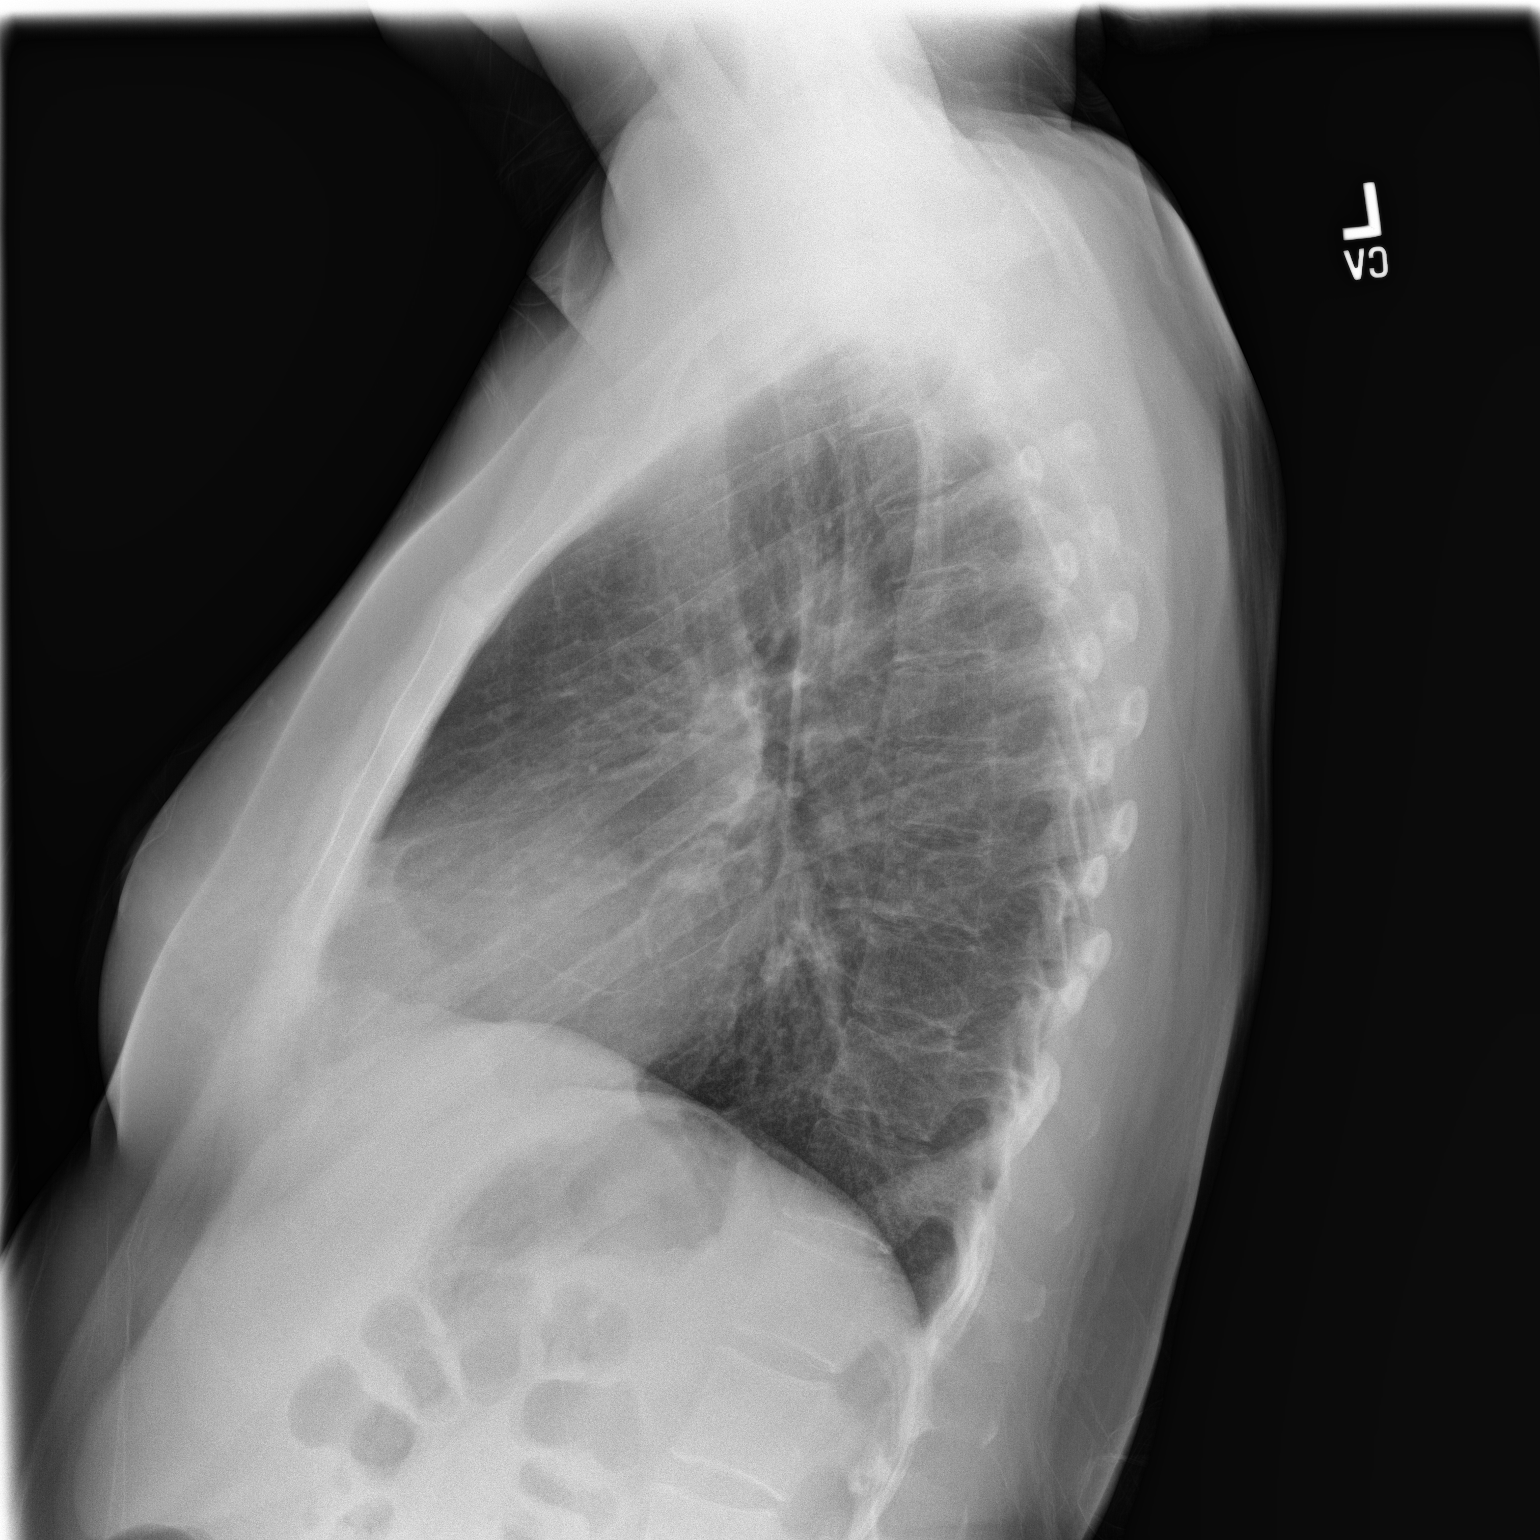

[2 of 2 positions shown; findings below may reference images not displayed]

FINDINGS: The lungs are clear and negative for focal airspace consolidation,
pulmonary edema or suspicious pulmonary nodule. Mild central
bronchitic changes are similar compared to prior. No pleural
effusion or pneumothorax. Cardiac and mediastinal contours are
within normal limits. No acute fracture or lytic or blastic osseous
lesions. The visualized upper abdominal bowel gas pattern is
unremarkable.
IMPRESSION: No active cardiopulmonary disease.

## 2015-11-13 ENCOUNTER — Ambulatory Visit
Admission: RE | Admit: 2015-11-13 | Discharge: 2015-11-13 | Disposition: A | Discharge status: Home / Self Care | Payer: Medicaid Other | Source: Ambulatory Visit | Attending: Physician Assistant | Admitting: Physician Assistant

## 2015-11-13 ENCOUNTER — Other Ambulatory Visit: Payer: Self-pay | Admitting: Physician Assistant

## 2015-11-13 DIAGNOSIS — R05 Cough: Secondary | ICD-10-CM

## 2015-11-13 DIAGNOSIS — R059 Cough, unspecified: Secondary | ICD-10-CM

## 2016-08-24 IMAGING — CR DG CHEST 2V
1 series · 2 of 2 positions shown · non-contrast
Comparison: June 17, 2015

CLINICAL DATA: Productive cough for 2 weeks

EXAM:
CHEST  2 VIEW

[Series 1: dg chest 2 view · 0.14mm/px · 2 of 2 slices shown]
[im 1/2]
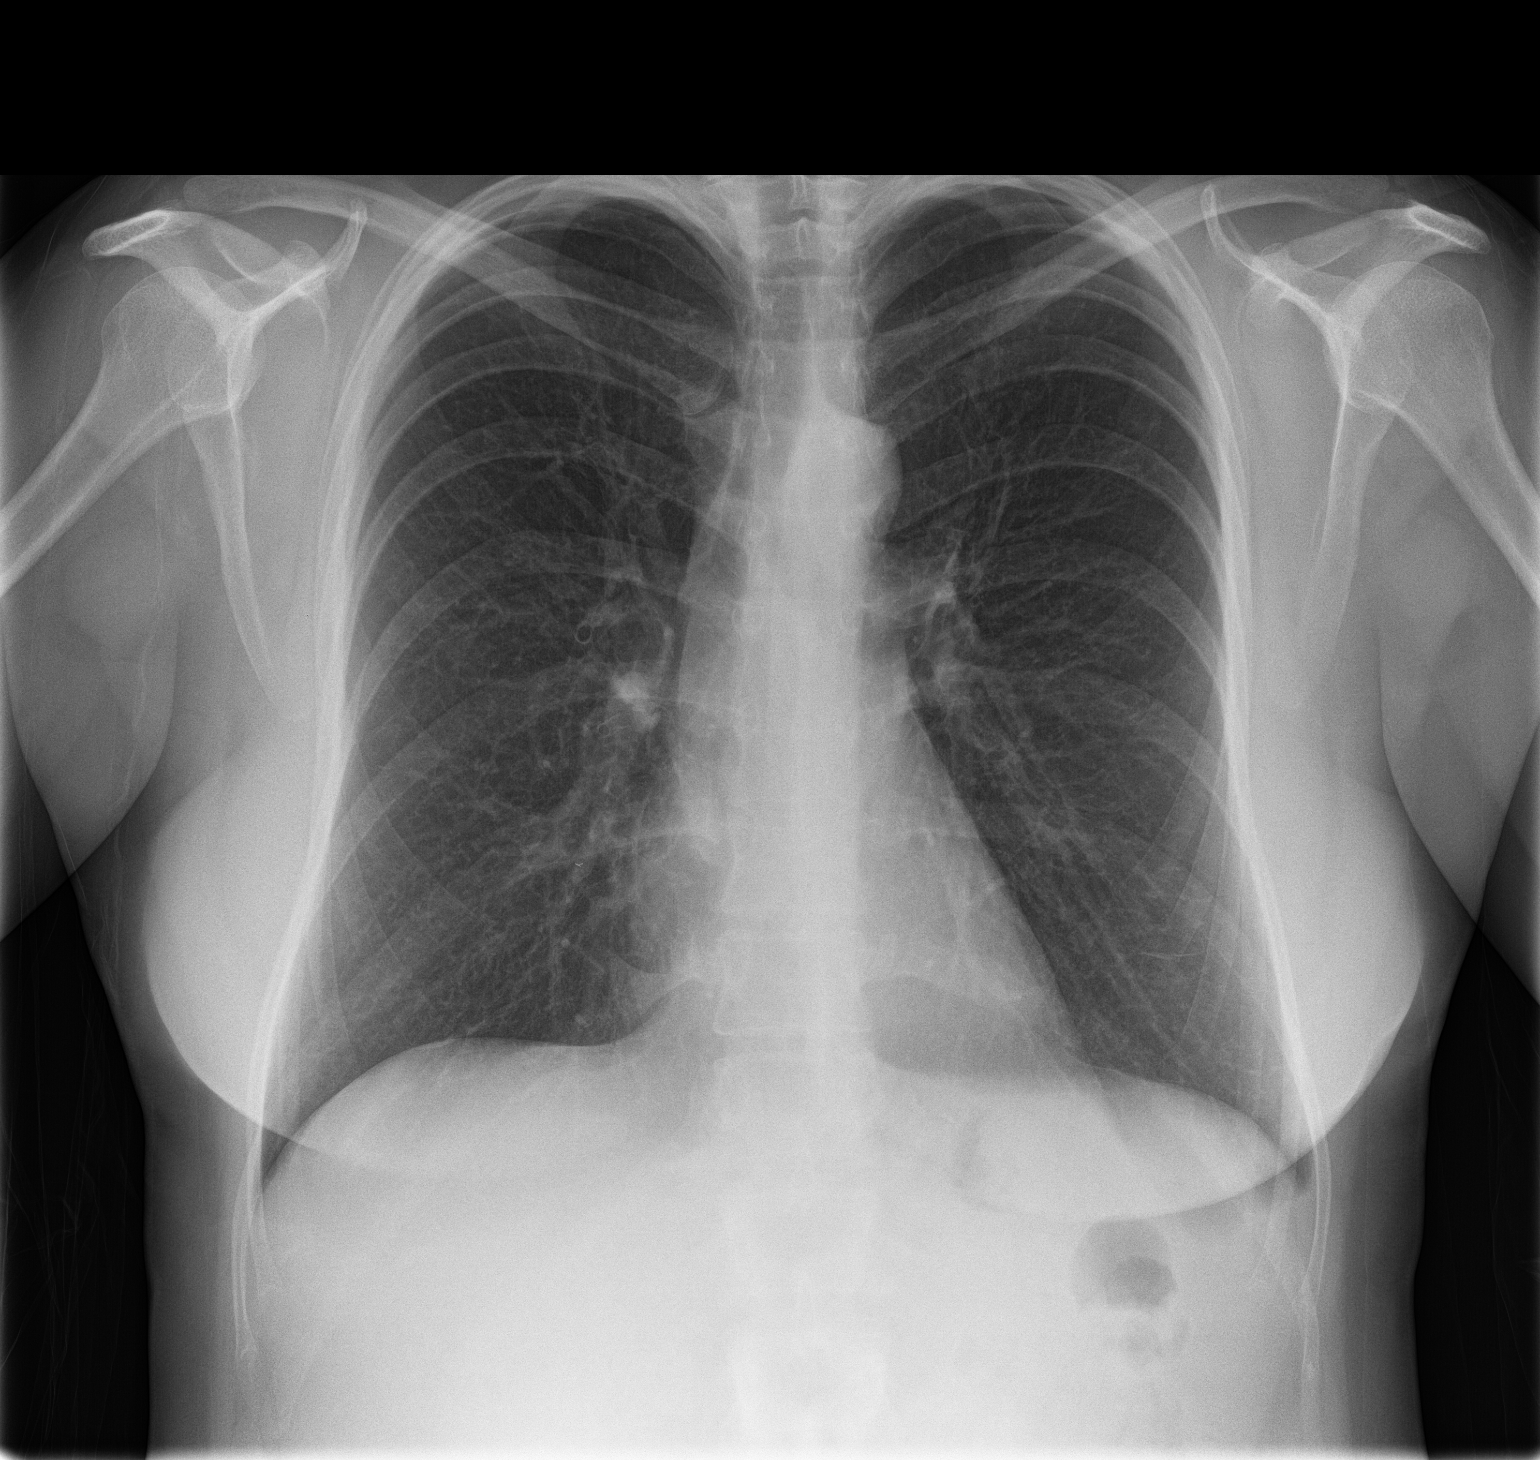
[im 2/2]
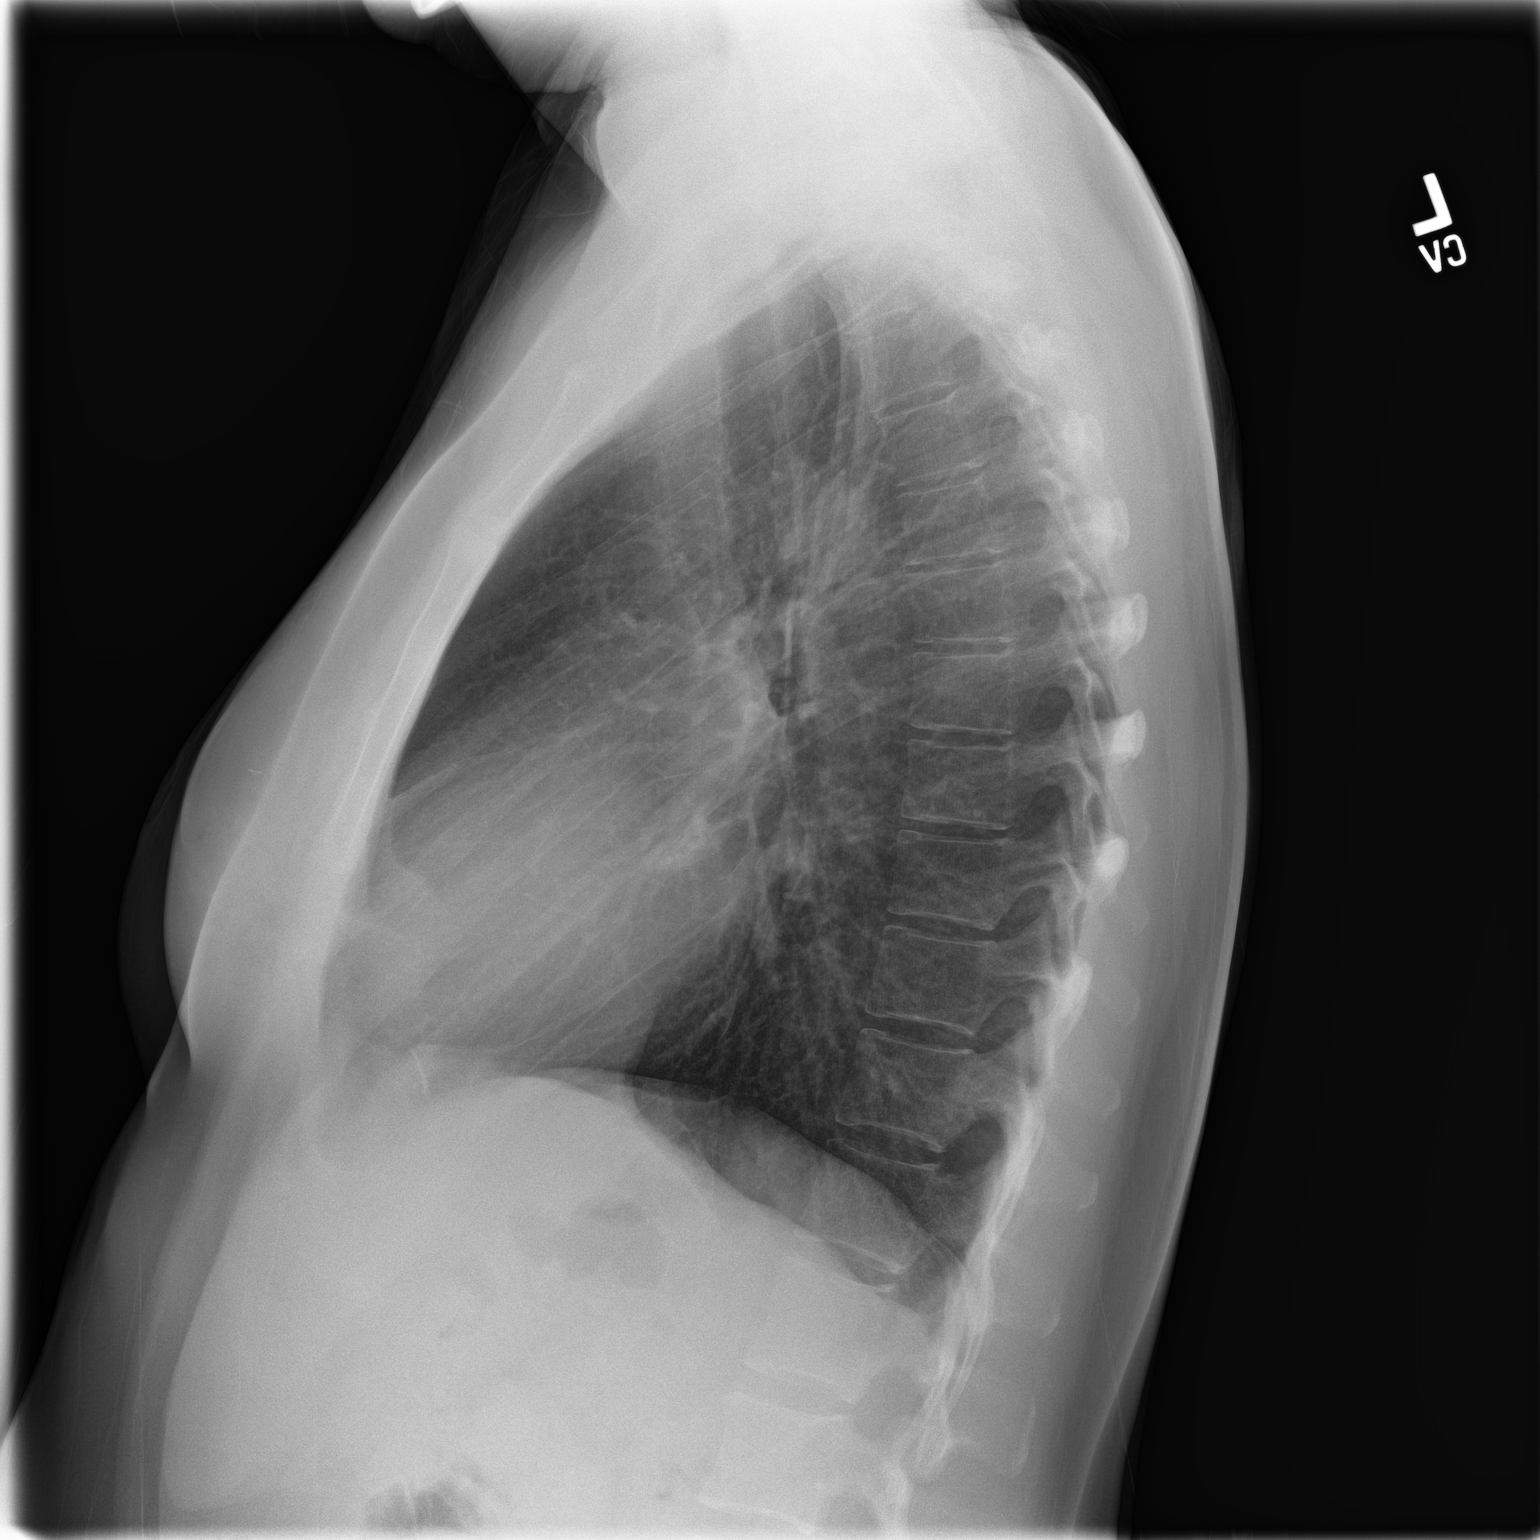

[2 of 2 positions shown; findings below may reference images not displayed]

FINDINGS: There is no edema or consolidation. Heart size and pulmonary
vascularity normal. No adenopathy. No bone lesions.
IMPRESSION: No edema or consolidation.

## 2016-11-03 ENCOUNTER — Ambulatory Visit: Payer: Self-pay

## 2016-12-06 ENCOUNTER — Ambulatory Visit: Payer: Self-pay

## 2017-01-03 ENCOUNTER — Ambulatory Visit
Admission: RE | Admit: 2017-01-03 | Discharge: 2017-01-03 | Disposition: A | Discharge status: Home / Self Care | Payer: Self-pay | Source: Ambulatory Visit | Attending: Oncology | Admitting: Oncology

## 2017-01-03 ENCOUNTER — Ambulatory Visit: Payer: Self-pay | Attending: Oncology

## 2017-01-03 VITALS — BP 130/80 | HR 76 | Temp 97.9°F | Ht 65.0 in | Wt 150.0 lb

## 2017-01-03 DIAGNOSIS — N63 Unspecified lump in unspecified breast: Secondary | ICD-10-CM

## 2017-01-03 NOTE — Progress Notes (Signed)
Subjective:     Patient ID: Joan Bass, female   DOB: 11-04-1975, 41 y.o.   MRN: [id number]  HPI   Review of Systems     Objective:   Physical Exam  Pulmonary/Chest: Right breast exhibits mass and tenderness. Right breast exhibits no inverted nipple, no nipple discharge and no skin change. Left breast exhibits mass and tenderness. Left breast exhibits no inverted nipple, no nipple discharge and no skin change. Breasts are symmetrical.         Assessment:     41 year old patient presents for [facility] clinic visit.  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  Patient screened, and meets BCCCP eligibility.  Patient does not have insurance, Medicare or Medicaid.  Handout given on Affordable Care Act.   Instructed patient on breast self-exam using teach back method.  Patient complains of bilateral breast masses with tenderness.  No visible lump, but palpated bilateral thickenings upper outer quadrant, and a tender nodule at 3 o'clock right breast.      Plan:     Sent patient for bilateral diagnostic mammogram, and ultrasound at the [facility].

## 2017-01-03 NOTE — Progress Notes (Signed)
Scheduled patient to see Dr. Bary Castilla due to bx recommendation, and surgical consult recommendation, with possible MRI.  She is seeing him on 01/11/17 at 8:30.  Patient notified of appointment date/time.  She is to arrive at 8:15 with photo ID, and current medications.  Notified Samantha in [place].

## 2017-01-11 ENCOUNTER — Encounter: Payer: Self-pay | Admitting: General Surgery

## 2017-01-11 ENCOUNTER — Ambulatory Visit (INDEPENDENT_AMBULATORY_CARE_PROVIDER_SITE_OTHER): Payer: PRIVATE HEALTH INSURANCE | Admitting: General Surgery

## 2017-01-11 VITALS — BP 122/78 | HR 100 | Resp 12 | Ht 65.0 in | Wt 150.0 lb

## 2017-01-11 DIAGNOSIS — R928 Other abnormal and inconclusive findings on diagnostic imaging of breast: Secondary | ICD-10-CM | POA: Diagnosis not present

## 2017-01-11 NOTE — Patient Instructions (Addendum)
The patient is aware to call back for any questions or concerns. The patient has been asked to return to the office in six months with a unilateral left breast diagnostic mammogram.

## 2017-01-11 NOTE — Progress Notes (Signed)
Patient ID: Joan Bass, female   DOB: 1975-09-01, 41 y.o.   MRN: [id number]  Chief Complaint  Patient presents with  . Breast Problem    HPI Joan Bass is a 41 y.o. female.  who presents for a breast evaluation. The most recent mammogram and ultrasound was done on 01-03-17. She states she was having right breast pain for one month before she went to see Koren Shiver RN. She thinks she can feel "knots" in the right breast. She states there is a pulling sensation when she rolls over in bed. She does admit to having mild tenderness in the left breast and feel a thickening in the left breast. The right breast tenderness prompted her to complete bilateral breast exam. Recently completed mammograms raised question the left breast.  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  Patient does not perform regular self breast checks and does not gets regular mammograms done.      HPI  Past Medical History:  Diagnosis Date  . Anxiety   . Asthma   . COPD (chronic obstructive pulmonary disease) ([place])   . Depression   . GERD (gastroesophageal reflux disease)   . Headache    migraines  . Shortness of breath dyspnea   . Sleep apnea     Past Surgical History:  Procedure Laterality Date  . ABDOMINAL HYSTERECTOMY    . BREAST BIOPSY Right 05/21/2015   Fibroadenoma  . COLONOSCOPY WITH PROPOFOL N/A 12/13/2014   Procedure: COLONOSCOPY WITH PROPOFOL;  Surgeon: Hulen Luster, MD;  Location: [facility] ENDOSCOPY;  Service: Gastroenterology;  Laterality: N/A;  . ESOPHAGOGASTRODUODENOSCOPY N/A 12/13/2014   Procedure: ESOPHAGOGASTRODUODENOSCOPY (EGD);  Surgeon: Hulen Luster, MD;  Location: [facility] ENDOSCOPY;  Service: Gastroenterology;  Laterality: N/A;  . left elbow    . left eye and cheek    . TUBAL LIGATION  1999    Family History  Problem Relation Age of Onset  . Breast cancer Paternal Aunt 32  . Lung cancer Paternal Uncle 45  .  Breast cancer Paternal Aunt        ? age    Social History Social History  Substance Use Topics  . Smoking status: Current Every Day Smoker    Packs/day: 0.50    Years: 26.00  . Smokeless tobacco: Never Used  . Alcohol use No    Allergies  Allergen Reactions  . Erythromycin Nausea And Vomiting  . Naproxen Other (See Comments)    Makes stool like coffee grinds     Current Outpatient Prescriptions  Medication Sig Dispense Refill  . albuterol (PROVENTIL HFA;VENTOLIN HFA) 108 (90 BASE) MCG/ACT inhaler Inhale 2 puffs into the lungs every 6 (six) hours as needed for wheezing or shortness of breath.    . ALPRAZolam (XANAX) 0.5 MG tablet Take 0.75 mg by mouth 4 (four) times daily as needed for anxiety.    . cetirizine (ZYRTEC) 10 MG tablet Take 10 mg by mouth as needed.     . gabapentin (NEURONTIN) 100 MG capsule Take 200 mg by mouth as needed.     Marland Kitchen ibuprofen (ADVIL,MOTRIN) 200 MG tablet Take 200 mg by mouth every 6 (six) hours as needed.    . montelukast (SINGULAIR) 10 MG tablet Take 10 mg by mouth as needed.      No current facility-administered medications for this visit.     Review of Systems Review of Systems  Constitutional: Negative.   Respiratory: Negative.   Cardiovascular: Negative.     Blood pressure 122/78, pulse 100, resp. rate 12, height 5\' 5"  (1.651 m), weight 150 lb (68 kg).  Physical Exam Physical Exam  Constitutional: She is oriented to person, place, and time. She appears well-developed and well-nourished.  HENT:  Mouth/Throat: Oropharynx is clear and moist.  Eyes: Conjunctivae are normal. No scleral icterus.  Neck: Neck supple.  Cardiovascular: Normal rate, regular rhythm and normal heart sounds.   Pulmonary/Chest: Effort normal and breath sounds normal. Right breast exhibits no inverted nipple, no mass, no nipple discharge, no skin change and no tenderness. Left breast exhibits no inverted nipple, no mass, no nipple discharge, no skin change and no  tenderness.    Lymphadenopathy:    She has no cervical adenopathy.    She has no axillary adenopathy.  Neurological: She is alert and oriented to person, place, and time.  Skin: Skin is warm and dry.  Psychiatric: Her behavior is normal.    Data Reviewed 05/08/2015 bilateral mammograms described was unremarkable. Based on the patient's report of a palpable mass in the 12:00 and 3:00 position ultrasound was completed identifying a 3 x 4 x 7 cm hypoechoic mass thought to represent a fibroadenoma. Subsequent ultrasound-guided core biopsy dated 05/21/2015 confirmed a fibroadenoma.  Review of films completed 05/20/2017 of the right breast confirms a biopsy clip in the medial aspect of the right breast.  Bilateral diagnostic mammograms dated 01/03/2017 showed no abnormality of the right breast. A left breast biopsy marker is reported although this is indeed on the right breast.  Reported "subtle area of architectural distortion the left breast, upper outer quadrant, mid depth which corresponds to one of the areas of palpable concern". Physical exam by the radiologist was notable for slight thickening upper-outer quadrant. Ultrasound of both breasts was negative.  Stereotactic biopsy of area of subtle architectural distortion recommended. Surgical consultation and contrast-enhanced breast MRI recommended regarding patient's palpable concern of the right breast. BI-RADS-4.  Baker Janus model risk for breast cancer based on menarche at age 64, first child at age 83 and no first-degree relatives with breast cancer in the absence of atypia on her 2016 biopsy: Five-year, 0.6%, lifetime 8.1%.  Assessment    Negative clinical exam.  Negative prior right breast biopsy.  Low risk for malignancy based on family history and Gail model assessment.      Plan    Options for management reviewed: 1) radiology directed left breast stereotactic biopsy for area of "distortion" versus 2) six-month follow-up exam.   Should the patient be anxious regarding the possibility of occult malignancy early biopsy would be appropriate.  No indication for surgical intervention of the right breast based on clinical exam or imaging studies.  No indication for breast MRI based on clinical exam nor personal / family history.    The patient has been asked to return to the office in six months with a unilateral left breast diagnostic mammogram.  The radiology service will be requested to submit an amended report correcting the right/left discrepancy regarding the previously completed right breast biopsy.  HPI, Physical Exam, Assessment and Plan have been scribed under the direction and in the presence of Robert Bellow, MD.  Karie Fetch, RN  I have completed the exam and reviewed the above documentation for accuracy and completeness.  I agree with the above.  Haematologist has been used and any errors in dictation or transcription are unintentional.  Hervey Ard, M.D.,  F.A.C.S.  Robert Bellow 01/12/2017, 6:50 AM

## 2017-01-12 ENCOUNTER — Encounter: Payer: Self-pay | Admitting: General Surgery

## 2017-01-12 DIAGNOSIS — R928 Other abnormal and inconclusive findings on diagnostic imaging of breast: Secondary | ICD-10-CM | POA: Insufficient documentation

## 2017-01-23 ENCOUNTER — Other Ambulatory Visit: Payer: Self-pay

## 2017-01-23 DIAGNOSIS — N63 Unspecified lump in unspecified breast: Secondary | ICD-10-CM

## 2017-07-18 ENCOUNTER — Other Ambulatory Visit: Payer: Self-pay

## 2017-07-18 DIAGNOSIS — N63 Unspecified lump in unspecified breast: Secondary | ICD-10-CM

## 2017-07-18 NOTE — Progress Notes (Signed)
Patient phoned the [place] [facility] [place] [facility], and reported to [facility] that she has a small painful right breast lump.  Changed her already scheduled 6 month follow-up left breast mammo and ultrasound to a bilateral diagnostic mammogram, bilateral breast ultrasound.  Imaging is scheduled for 07/11/17.  Patient has an appointment with Dr. Bary Castilla on 08/02/17.

## 2017-07-20 ENCOUNTER — Ambulatory Visit: Payer: Self-pay | Attending: Oncology

## 2017-07-21 ENCOUNTER — Ambulatory Visit
Admission: RE | Admit: 2017-07-21 | Discharge: 2017-07-21 | Disposition: A | Discharge status: Home / Self Care | Payer: Medicaid Other | Source: Ambulatory Visit | Attending: Oncology | Admitting: Oncology

## 2017-07-21 DIAGNOSIS — N63 Unspecified lump in unspecified breast: Secondary | ICD-10-CM | POA: Insufficient documentation

## 2017-08-02 ENCOUNTER — Ambulatory Visit: Payer: Self-pay | Admitting: General Surgery

## 2017-08-02 ENCOUNTER — Encounter: Payer: Self-pay | Admitting: General Surgery

## 2017-08-02 NOTE — Progress Notes (Signed)
The patient had a follow-up mammogram and left breast ultrasound on July 21, 2017.  Area of radiologist concern from summer 2018 resolved.  The patient failed to present for her post mammogram office visit.  No follow-up in this office is planned.

## 2017-08-03 NOTE — Progress Notes (Signed)
Letter mailed from [facility] to notify of normal mammogram results.  Patient to return in one year for annual screening.  Per Dr. Dwyane Luo note, patient did not show up for follow-up.  [facility] to [facility].

## 2017-08-25 ENCOUNTER — Encounter: Payer: Self-pay | Admitting: *Deleted

## 2017-09-04 IMAGING — US US BREAST LTD UNI RIGHT INC AXILLA
1 series · 8 of 8 positions shown · non-contrast
Comparison: None.

CLINICAL DATA: 39-year-old female with palpable area of concern
within the right breast at 12 o'clock for 1 week. The patient's
physician also felt a lump in the right breast at 3 o'clock.

EXAM:
DIGITAL DIAGNOSTIC BILATERAL MAMMOGRAM WITH 3D TOMOSYNTHESIS WITH
CAD
ULTRASOUND RIGHT BREAST

[Series 1: us breast ltd uni right inc axilla · 0.04mm/px · 8 of 8 slices shown]
[im 1/8]
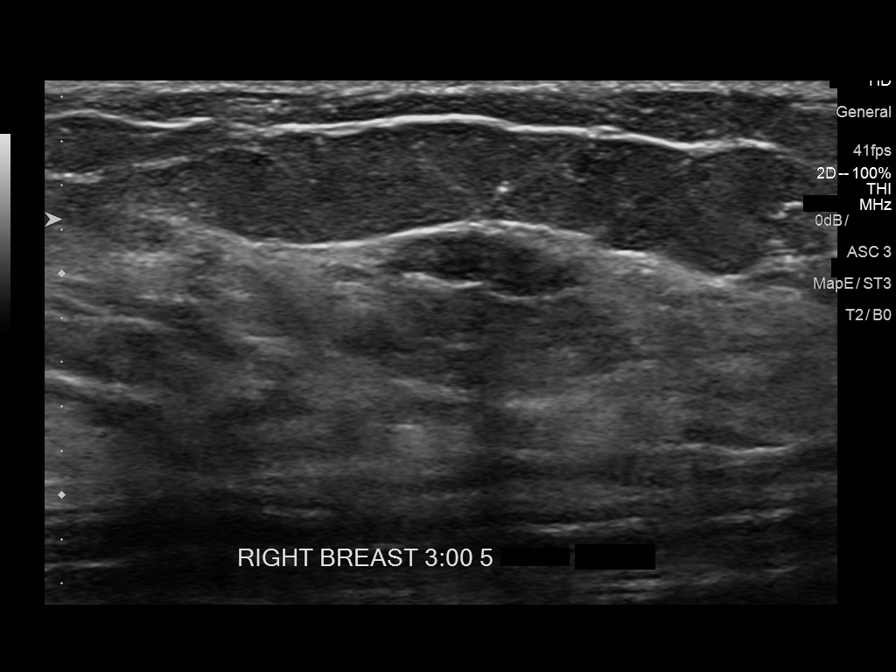
[im 2/8]
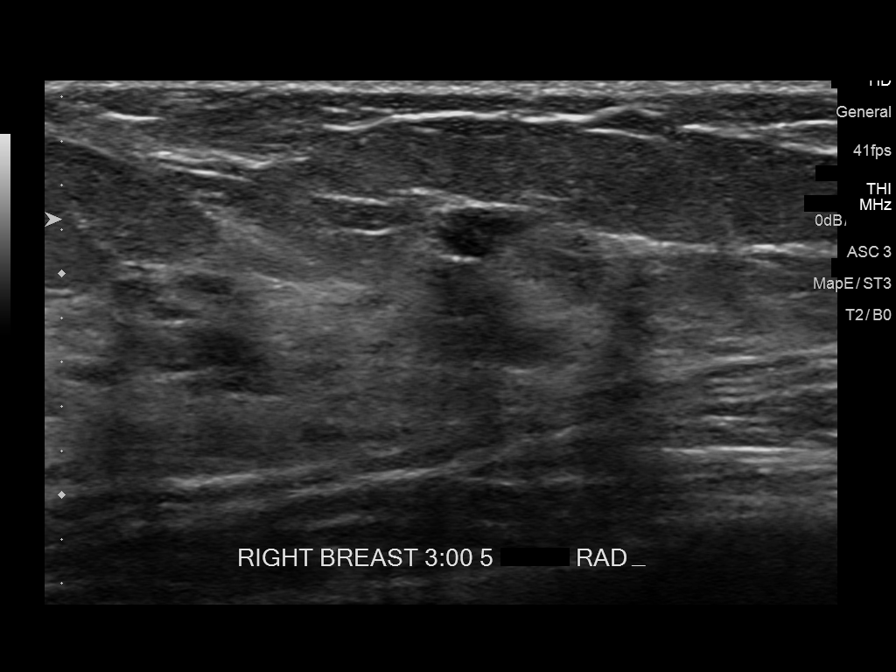
[im 3/8]
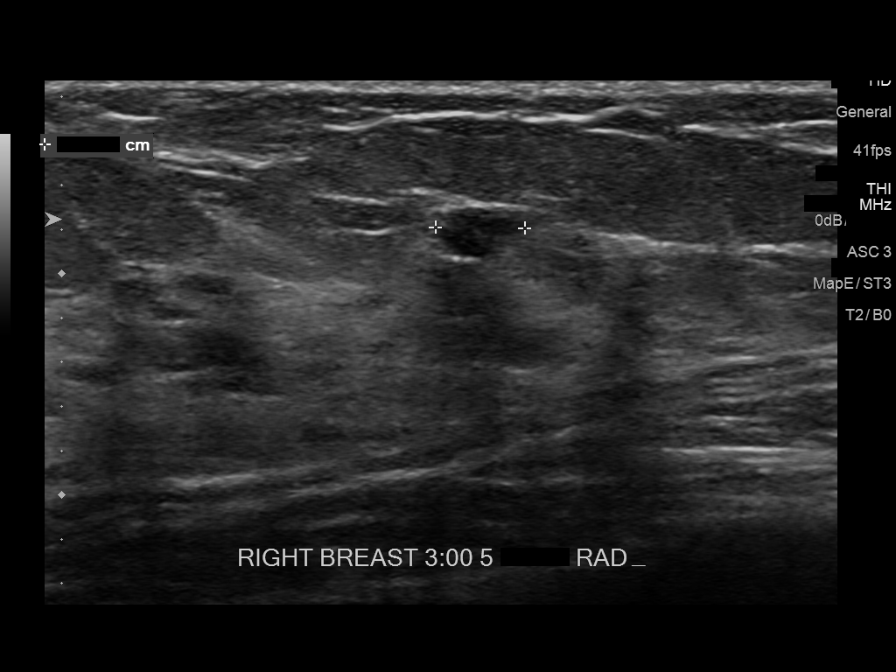
[im 4/8]
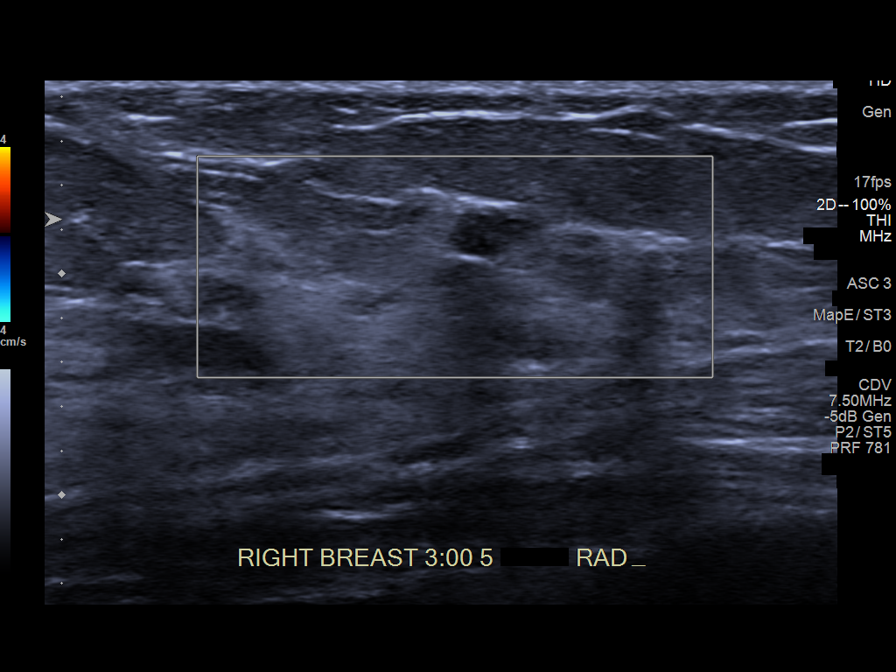
[im 5/8]
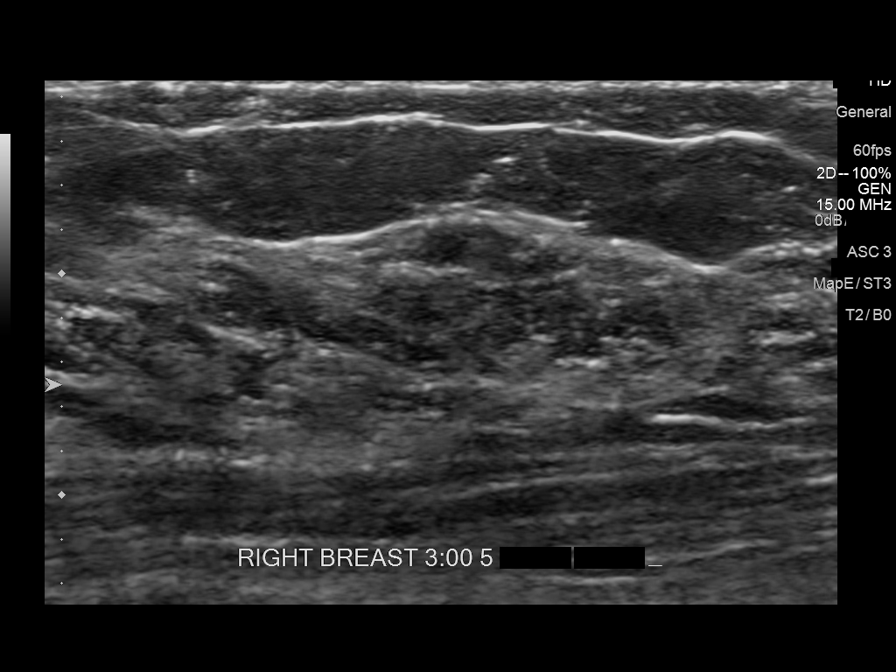
[im 6/8]
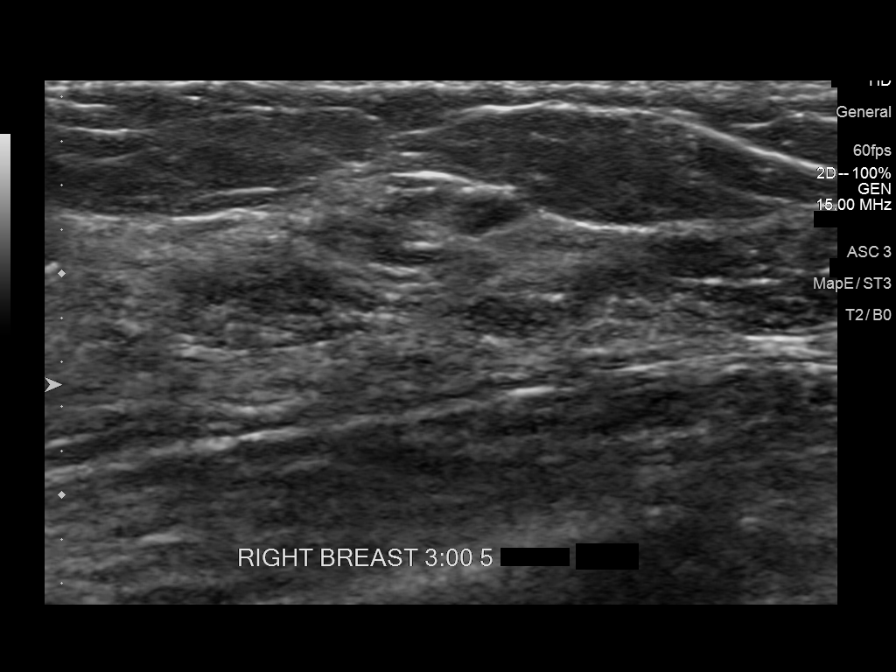
[im 7/8]
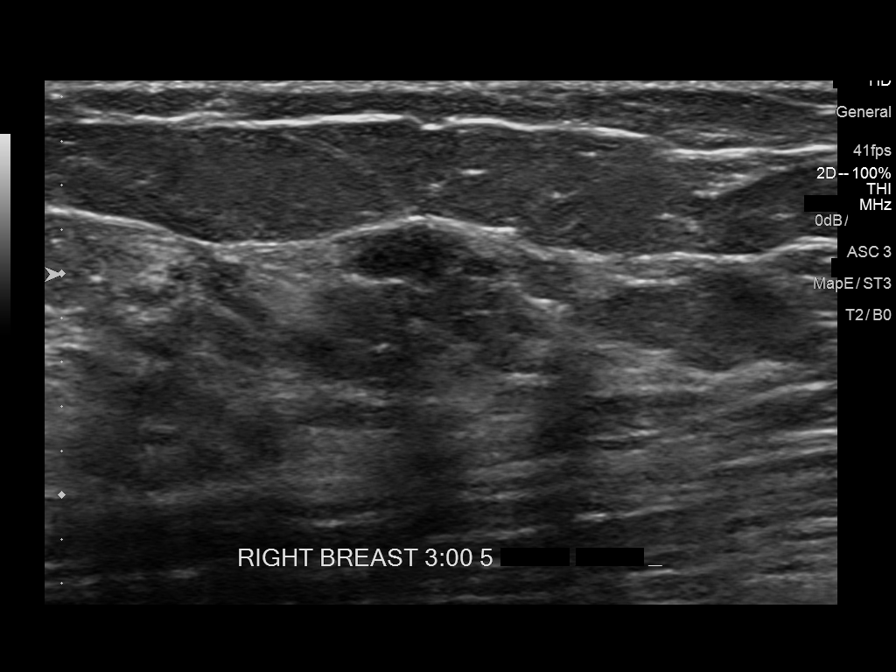
[im 8/8]
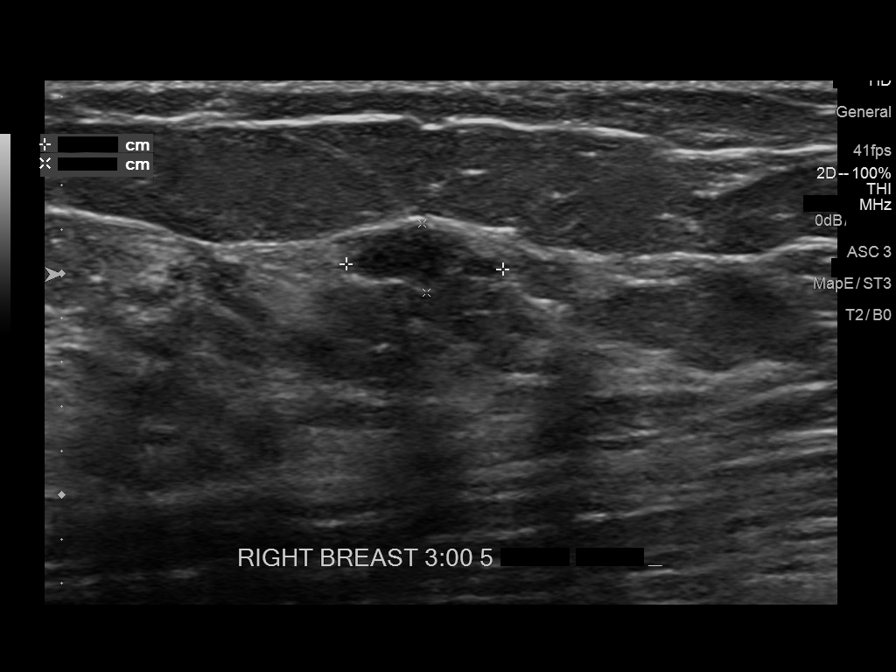

[8 of 8 positions shown; findings below may reference images not displayed]

ACR Breast Density Category c: The breast tissue is heterogeneously
dense, which may obscure small masses.
FINDINGS: CC and MLO views of both breasts were obtained. Additional spot
tangential views of the areas of concern within the right breast
were obtained. No suspicious mass, calcifications, or other
abnormality is identified within either breast.

Mammographic images were processed with CAD.

On physical exam, no discrete mass is felt in the areas of concern
within the right breast 12 and 3 o'clock.

Targeted ultrasound is performed, showing no suspicious cystic or
solid sonographic finding in the area of concern within the right
breast at 12 o'clock. At 3 o'clock, 5 cm from the nipple, there is
an oval, circumscribed, hypoechoic mass measuring 7 x 3 x 4 mm. No
internal vascularity is identified. This finding may represent
either a fibroadenoma or area of fibrocystic change.
IMPRESSION: Probably benign right breast finding.

RECOMMENDATION:
Ultrasound-guided biopsy of the right breast mass at 3 o'clock, 5 cm
from the nipple. Options including short-term follow-up versus
ultrasound-guided biopsy were discussed with the patient. The
patient wishes to pursue biopsy.

I have discussed the findings and recommendations with the patient.
Results were also provided in writing at the conclusion of the
visit. If applicable, a reminder letter will be sent to the patient
regarding the next appointment.

BI-RADS CATEGORY  3: Probably benign.

## 2020-10-30 ENCOUNTER — Other Ambulatory Visit: Payer: Self-pay

## 2020-10-30 ENCOUNTER — Emergency Department
Admission: EM | Admit: 2020-10-30 | Discharge: 2020-10-30 | Disposition: A | Discharge status: Home / Self Care | Payer: Medicaid Other | Attending: Emergency Medicine | Admitting: Emergency Medicine

## 2020-10-30 DIAGNOSIS — F17200 Nicotine dependence, unspecified, uncomplicated: Secondary | ICD-10-CM | POA: Insufficient documentation

## 2020-10-30 DIAGNOSIS — J449 Chronic obstructive pulmonary disease, unspecified: Secondary | ICD-10-CM | POA: Insufficient documentation

## 2020-10-30 DIAGNOSIS — J45909 Unspecified asthma, uncomplicated: Secondary | ICD-10-CM | POA: Insufficient documentation

## 2020-10-30 DIAGNOSIS — G43909 Migraine, unspecified, not intractable, without status migrainosus: Secondary | ICD-10-CM | POA: Insufficient documentation

## 2020-10-30 MED ORDER — KETOROLAC TROMETHAMINE 30 MG/ML IJ SOLN
30.0000 mg | Freq: Once | INTRAMUSCULAR | Status: AC
  Administered 2020-10-30: 30 mg via INTRAVENOUS
  Filled 2020-10-30: qty 1

## 2020-10-30 MED ORDER — METOCLOPRAMIDE HCL 5 MG/ML IJ SOLN
10.0000 mg | Freq: Once | INTRAMUSCULAR | Status: AC
Start: 2020-10-30 — End: 2020-10-30
  Administered 2020-10-30: 10 mg via INTRAVENOUS
  Filled 2020-10-30: qty 2

## 2020-10-30 MED ORDER — DIPHENHYDRAMINE HCL 50 MG/ML IJ SOLN
50.0000 mg | Freq: Once | INTRAMUSCULAR | Status: AC
  Administered 2020-10-30: 50 mg via INTRAVENOUS
  Filled 2020-10-30: qty 1

## 2020-10-30 MED ORDER — SODIUM CHLORIDE 0.9 % IV BOLUS
1000.0000 mL | Freq: Once | INTRAVENOUS | Status: AC
  Administered 2020-10-30: 1000 mL via INTRAVENOUS

## 2020-10-30 NOTE — ED Notes (Signed)
Declines EKG at this time. "It's always high like this". Reports 120BPM her normal due to COPD.

## 2020-10-30 NOTE — ED Provider Notes (Signed)
[facility] Emergency Department Provider Note  Time seen: 12:50 PM  I have reviewed the triage vital signs and the nursing notes.   HISTORY  Chief Complaint Migraine   HPI Joan Bass is a 45 y.o. female with a past medical history of anxiety, asthma, COPD, gastric reflux, presents to the emergency department for migraine headache.  According to the patient she often gets migraine headaches, states she was taking various medications for her migraines when she had insurance but recently lost her health insurance.  Patient states this particular migraine has been ongoing for 3 days now which is not atypical per patient, but she used her medications to try to take at home and no longer does.  Patient denies any weakness or numbness confusion or slurred speech.  Denies any fever.  Patient does state pain with light and loud noises which is also typical for her migraines along with nausea at times which again is also typical for her migraines per patient.   Past Medical History:  Diagnosis Date  . Anxiety   . Asthma   . COPD (chronic obstructive pulmonary disease) ([place])   . Depression   . GERD (gastroesophageal reflux disease)   . Headache    migraines  . Shortness of breath dyspnea   . Sleep apnea     Patient Active Problem List   Diagnosis Date Noted  . Abnormal mammogram 01/12/2017    Past Surgical History:  Procedure Laterality Date  . ABDOMINAL HYSTERECTOMY    . BREAST BIOPSY Right 05/21/2015   Fibroadenoma  . COLONOSCOPY WITH PROPOFOL N/A 12/13/2014   Procedure: COLONOSCOPY WITH PROPOFOL;  Surgeon: Hulen Luster, MD;  Location: [facility] ENDOSCOPY;  Service: Gastroenterology;  Laterality: N/A;  . ESOPHAGOGASTRODUODENOSCOPY N/A 12/13/2014   Procedure: ESOPHAGOGASTRODUODENOSCOPY (EGD);  Surgeon: Hulen Luster, MD;  Location[facility] ENDOSCOPY;  Service: Gastroenterology;  Laterality: N/A;  . left elbow    . left eye and cheek    . TUBAL LIGATION  1999    Prior  to Admission medications   Medication Sig Start Date End Date Taking? Authorizing Provider  albuterol (PROVENTIL HFA;VENTOLIN HFA) 108 (90 BASE) MCG/ACT inhaler Inhale 2 puffs into the lungs every 6 (six) hours as needed for wheezing or shortness of breath.    [provider]  ALPRAZolam Duanne Moron) 0.5 MG tablet Take 0.75 mg by mouth 4 (four) times daily as needed for anxiety.    [provider]  cetirizine (ZYRTEC) 10 MG tablet Take 10 mg by mouth as needed.     [provider]  gabapentin (NEURONTIN) 100 MG capsule Take 200 mg by mouth as needed.     [provider]  ibuprofen (ADVIL,MOTRIN) 200 MG tablet Take 200 mg by mouth every 6 (six) hours as needed.    [provider]  montelukast (SINGULAIR) 10 MG tablet Take 10 mg by mouth as needed.     [provider]    Allergies  Allergen Reactions  . Erythromycin Nausea And Vomiting  . Naproxen Other (See Comments)    Makes stool like coffee grinds     Family History  Problem Relation Age of Onset  . Breast cancer Paternal Aunt 25  . Lung cancer Paternal Uncle 46  . Breast cancer Paternal Aunt        ? age    Social History Social History   Tobacco Use  . Smoking status: Current Every Day Smoker    Packs/day: 0.50  Years: 26.00    Pack years: 13.00  . Smokeless tobacco: Never Used  Substance Use Topics  . Alcohol use: No  . Drug use: No    Review of Systems Constitutional: Negative for fever. Cardiovascular: Negative for chest pain. Respiratory: Negative for shortness of breath.  Negative for cough. Gastrointestinal: Negative for abdominal pain, vomiting.  Occasional nausea. Musculoskeletal: Negative for musculoskeletal complaints Neurological: Moderate headache.  Denies weakness numbness confusion or slurred speech. All other ROS negative  ____________________________________________   PHYSICAL EXAM:  VITAL SIGNS: ED Triage Vitals  Enc Vitals Group     BP  10/30/20 1150 (!) 133/102     Pulse Rate 10/30/20 1150 (!) 128     Resp 10/30/20 1150 (!) 22     Temp 10/30/20 1150 98 F (36.7 C)     Temp Source 10/30/20 1150 Oral     SpO2 10/30/20 1150 96 %     Weight 10/30/20 1151 145 lb (65.8 kg)     Height 10/30/20 1151 5\' 5"  (1.651 m)     Head Circumference --      Peak Flow --      Pain Score 10/30/20 1151 7     Pain Loc --      Pain Edu? --      Excl. in [place]? --     Constitutional: Alert and oriented. Well appearing and in no distress. Eyes: Normal exam ENT      Head: Normocephalic and atraumatic.      Mouth/Throat: Mucous membranes are moist. Cardiovascular: Normal rate, regular rhythm.  Respiratory: Normal respiratory effort without tachypnea nor retractions. Breath sounds are clear  Gastrointestinal: Soft and nontender. No distention.   Musculoskeletal: Nontender with normal range of motion in all extremities.  Neurologic:  Normal speech and language. No gross focal neurologic deficits.  Equal grip strengths.  No pronator drift, cranial nerves intact. Skin:  Skin is warm, dry and intact.  Psychiatric: Mood and affect are normal.   ____________________________________________   INITIAL IMPRESSION / ASSESSMENT AND PLAN / ED COURSE  Pertinent labs & imaging results that were available during my care of the patient were reviewed by me and considered in my medical decision making (see chart for details).   Patient presents to the emergency department for a headache ongoing for the past 3 days.  Patient has a history of migraines and states this feels similar to her typical migraine but she no longer has medications to try to take at home.  States she is taken both ibuprofen as well as Tylenol without relief.  Patient overall appears well has a reassuring physical exam slightly hypertensive but otherwise reassuring vitals.  We will treat the patient's migraine with Toradol Reglan Benadryl and IV fluids.  Patient agreeable to plan of  care.  Headache is improved.  We will discharge patient home with PCP follow-up.  Joan Bass was evaluated in Emergency Department on 10/30/2020 for the symptoms described in the history of present illness. She was evaluated in the context of the global COVID-19 pandemic, which necessitated consideration that the patient might be at risk for infection with the SARS-CoV-2 virus that causes COVID-19. Institutional protocols and algorithms that pertain to the evaluation of patients at risk for COVID-19 are in a state of rapid change based on information released by regulatory bodies including the CDC and federal and state organizations. These policies and algorithms were followed during the patient's care in the ED.  ____________________________________________   FINAL CLINICAL IMPRESSION(S) /  ED DIAGNOSES  Migraine   Harvest Dark, MD 10/30/20 1346

## 2020-10-30 NOTE — ED Triage Notes (Signed)
Reports migraine since Tuesday, sensitivity to light and noise. Has hx of hypertension, does not take medications due to lack of insurance. Reports HR "always high".

## 2024-03-30 ENCOUNTER — Encounter: Payer: Self-pay | Admitting: Family

## 2024-03-30 ENCOUNTER — Ambulatory Visit: Payer: Self-pay | Admitting: Family

## 2024-03-30 VITALS — BP 120/86 | HR 100 | Ht 65.0 in | Wt 132.6 lb

## 2024-03-30 DIAGNOSIS — E538 Deficiency of other specified B group vitamins: Secondary | ICD-10-CM

## 2024-03-30 DIAGNOSIS — R7303 Prediabetes: Secondary | ICD-10-CM

## 2024-03-30 DIAGNOSIS — J449 Chronic obstructive pulmonary disease, unspecified: Secondary | ICD-10-CM

## 2024-03-30 DIAGNOSIS — I10 Essential (primary) hypertension: Secondary | ICD-10-CM | POA: Diagnosis not present

## 2024-03-30 DIAGNOSIS — K121 Other forms of stomatitis: Secondary | ICD-10-CM | POA: Diagnosis not present

## 2024-03-30 DIAGNOSIS — R5383 Other fatigue: Secondary | ICD-10-CM

## 2024-03-30 DIAGNOSIS — F410 Panic disorder [episodic paroxysmal anxiety] without agoraphobia: Secondary | ICD-10-CM

## 2024-03-30 DIAGNOSIS — E782 Mixed hyperlipidemia: Secondary | ICD-10-CM | POA: Diagnosis not present

## 2024-03-30 DIAGNOSIS — F4312 Post-traumatic stress disorder, chronic: Secondary | ICD-10-CM

## 2024-03-30 DIAGNOSIS — E039 Hypothyroidism, unspecified: Secondary | ICD-10-CM

## 2024-03-30 DIAGNOSIS — E559 Vitamin D deficiency, unspecified: Secondary | ICD-10-CM
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
## 2024-03-31 LAB — CBC WITH DIFFERENTIAL/PLATELET
Basophils Absolute: 0 x10E3/uL (ref 0.0–0.2)
Basos: 0 %
EOS (ABSOLUTE): 0.2 x10E3/uL (ref 0.0–0.4)
Eos: 3 %
Hematocrit: 37.2 % (ref 34.0–46.6)
Hemoglobin: 12.4 g/dL (ref 11.1–15.9)
Immature Grans (Abs): 0 x10E3/uL (ref 0.0–0.1)
Immature Granulocytes: 0 %
Lymphocytes Absolute: 1.6 x10E3/uL (ref 0.7–3.1)
Lymphs: 22 %
MCH: 33.3 pg — ABNORMAL HIGH (ref 26.6–33.0)
MCHC: 33.3 g/dL (ref 31.5–35.7)
MCV: 100 fL — ABNORMAL HIGH (ref 79–97)
Monocytes Absolute: 0.7 x10E3/uL (ref 0.1–0.9)
Monocytes: 9 %
Neutrophils Absolute: 4.8 x10E3/uL (ref 1.4–7.0)
Neutrophils: 66 %
Platelets: 302 x10E3/uL (ref 150–450)
RBC: 3.72 x10E6/uL — ABNORMAL LOW (ref 3.77–5.28)
RDW: 12 % (ref 11.7–15.4)
WBC: 7.4 x10E3/uL (ref 3.4–10.8)

## 2024-03-31 LAB — CMP14+EGFR
ALT: 18 IU/L (ref 0–32)
AST: 22 IU/L (ref 0–40)
Albumin: 4.4 g/dL (ref 3.9–4.9)
Alkaline Phosphatase: 102 IU/L (ref 44–121)
BUN/Creatinine Ratio: 19 (ref 9–23)
BUN: 12 mg/dL (ref 6–24)
Bilirubin Total: 0.5 mg/dL (ref 0.0–1.2)
CO2: 25 mmol/L (ref 20–29)
Calcium: 9.6 mg/dL (ref 8.7–10.2)
Chloride: 99 mmol/L (ref 96–106)
Creatinine, Ser: 0.63 mg/dL (ref 0.57–1.00)
Globulin, Total: 3.1 g/dL (ref 1.5–4.5)
Glucose: 91 mg/dL (ref 70–99)
Potassium: 4.3 mmol/L (ref 3.5–5.2)
Sodium: 138 mmol/L (ref 134–144)
Total Protein: 7.5 g/dL (ref 6.0–8.5)
eGFR: 109 mL/min/1.73 (ref >59)

## 2024-03-31 LAB — VITAMIN B12: Vitamin B-12: 306 pg/mL (ref 232–1245)

## 2024-03-31 LAB — HEMOGLOBIN A1C
Est. average glucose Bld gHb Est-mCnc: 97 mg/dL
Hgb A1c MFr Bld: 5 % (ref 4.8–5.6)

## 2024-03-31 LAB — VITAMIN D 25 HYDROXY (VIT D DEFICIENCY, FRACTURES): Vit D, 25-Hydroxy: 24.8 ng/mL — ABNORMAL LOW (ref 30.0–100.0)

## 2024-03-31 LAB — LIPID PANEL
Chol/HDL Ratio: 1.7 ratio (ref 0.0–4.4)
Cholesterol, Total: 201 mg/dL — ABNORMAL HIGH (ref 100–199)
HDL: 120 mg/dL (ref >39)
LDL Chol Calc (NIH): 71 mg/dL (ref 0–99)
Triglycerides: 54 mg/dL (ref 0–149)
VLDL Cholesterol Cal: 10 mg/dL (ref 5–40)

## 2024-03-31 LAB — TSH: TSH: 2.01 u[IU]/mL (ref 0.450–4.500)

## 2024-04-02 ENCOUNTER — Ambulatory Visit: Payer: Self-pay

## 2024-04-03 ENCOUNTER — Telehealth: Payer: Self-pay

## 2024-04-03 NOTE — Telephone Encounter (Signed)
 Family member with pt called and left vm regarding rx refills, requesting a call back. Stated pt had appt on Friday and didn't state which medications pt was needing. Please advise

## 2024-04-04 DIAGNOSIS — K121 Other forms of stomatitis: Secondary | ICD-10-CM | POA: Insufficient documentation

## 2024-04-04 DIAGNOSIS — F4312 Post-traumatic stress disorder, chronic: Secondary | ICD-10-CM | POA: Insufficient documentation

## 2024-04-04 DIAGNOSIS — F410 Panic disorder [episodic paroxysmal anxiety] without agoraphobia: Secondary | ICD-10-CM | POA: Insufficient documentation

## 2024-04-04 MED ORDER — ALPRAZOLAM 0.5 MG PO TABS: 0.5000 mg | ORAL_TABLET | Freq: Three times a day (TID) | ORAL | 0 refills | Status: DC | PRN

## 2024-04-04 NOTE — Assessment & Plan Note (Signed)
 Patient previously taking alprazolam  QID.  I have suggested that we decrease the frequency, will send RX for her for short term.  Patient will also set up appointment with [facility].

## 2024-04-04 NOTE — Assessment & Plan Note (Signed)
 Setting patient up for referral to ENT .  Will defer to them for further treatment changes.  Reassess at follow up.

## 2024-04-13 ENCOUNTER — Encounter: Payer: Self-pay | Admitting: Family

## 2024-04-13 ENCOUNTER — Ambulatory Visit: Admitting: Family

## 2024-04-13 VITALS — BP 104/68 | HR 91 | Ht 65.0 in | Wt 137.6 lb

## 2024-04-13 DIAGNOSIS — E559 Vitamin D deficiency, unspecified: Secondary | ICD-10-CM | POA: Diagnosis not present

## 2024-04-13 DIAGNOSIS — J449 Chronic obstructive pulmonary disease, unspecified: Secondary | ICD-10-CM | POA: Diagnosis not present

## 2024-04-13 DIAGNOSIS — K121 Other forms of stomatitis: Secondary | ICD-10-CM

## 2024-04-13 DIAGNOSIS — E538 Deficiency of other specified B group vitamins: Secondary | ICD-10-CM | POA: Diagnosis not present

## 2024-04-13 DIAGNOSIS — F410 Panic disorder [episodic paroxysmal anxiety] without agoraphobia: Secondary | ICD-10-CM

## 2024-04-13 DIAGNOSIS — F4312 Post-traumatic stress disorder, chronic: Secondary | ICD-10-CM

## 2024-04-13 MED ORDER — ALPRAZOLAM 0.5 MG PO TABS: 0.5000 mg | ORAL_TABLET | Freq: Three times a day (TID) | ORAL | 2 refills | Status: DC | PRN

## 2024-04-13 MED ORDER — CYANOCOBALAMIN 1000 MCG/ML IJ SOLN: 1000.0000 ug | INTRAMUSCULAR | 3 refills | Status: DC

## 2024-04-13 MED ORDER — VITAMIN D (ERGOCALCIFEROL) 1.25 MG (50000 UNIT) PO CAPS: 50000.0000 [IU] | ORAL_CAPSULE | ORAL | 1 refills | Status: AC

## 2024-04-13 NOTE — Assessment & Plan Note (Signed)
 Referral has been sent to [facility] ENT, patient given contact information.   Will defer to them for further treatment.

## 2024-04-13 NOTE — Assessment & Plan Note (Signed)
 Patient stable.  Well controlled with current therapy.   Continue current meds.

## 2024-04-13 NOTE — Assessment & Plan Note (Signed)
 Vitamin D  and B12 both low on labwork.  Sending RX for pt for both.  She will come in monthly for B12 shots.

## 2024-04-13 NOTE — Patient Instructions (Addendum)
 [facility] [place]  [address] [place], [place] [address]  Phone: [phone]

## 2024-04-13 NOTE — Progress Notes (Signed)
 Established Patient Office Visit  Subjective:  Patient ID: Joan Bass, female    DOB: 1976/05/17  Age: 48 y.o. MRN: [id number]  Chief Complaint  Patient presents with   Follow-up    2 week follow up    Pt. Here today for 2 week n/p follow up.   Had labs done at that time, so we will review in detail today.   Labs: B12 is on the low end of normal, Vitamin D  is low.   Needs refill  Has not heard from ENT yet.   No other concerns today.      No other concerns at this time.   Past Medical History:  Diagnosis Date   Anxiety    Asthma    COPD (chronic obstructive pulmonary disease) (HCC)    Depression    GERD (gastroesophageal reflux disease)    Headache    migraines   Shortness of breath dyspnea    Sleep apnea     Past Surgical History:  Procedure Laterality Date   ABDOMINAL HYSTERECTOMY     BREAST BIOPSY Right 05/21/2015   Fibroadenoma   COLONOSCOPY WITH PROPOFOL  N/A 12/13/2014   Procedure: COLONOSCOPY WITH PROPOFOL ;  Surgeon: Deward CINDERELLA Piedmont, MD;  Location: [facility] ENDOSCOPY;  Service: Gastroenterology;  Laterality: N/A;   ESOPHAGOGASTRODUODENOSCOPY N/A 12/13/2014   Procedure: ESOPHAGOGASTRODUODENOSCOPY (EGD);  Surgeon: Deward CINDERELLA Piedmont, MD;  Location: [facility] ENDOSCOPY;  Service: Gastroenterology;  Laterality: N/A;   left elbow     left eye and cheek     TUBAL LIGATION  1999    Social History   Socioeconomic History   Marital status: Single    Spouse name: Not on file   Number of children: Not on file   Years of education: Not on file   Highest education level: Not on file  Occupational History   Not on file  Tobacco Use   Smoking status: Every Day    Current packs/day: 0.50    Average packs/day: 0.5 packs/day for 26.0 years (13.0 ttl pk-yrs)    Types: Cigarettes   Smokeless tobacco: Never  Substance and Sexual Activity   Alcohol use: No   Drug use: No   Sexual activity: Not on file  Other Topics Concern   Not on file  Social History Narrative   Not on file    Social Drivers of Health   Financial Resource Strain: Not on file  Food Insecurity: Not on file  Transportation Needs: Not on file  Physical Activity: Not on file  Stress: Not on file  Social Connections: Not on file  Intimate Partner Violence: Not on file    Family History  Problem Relation Age of Onset   Breast cancer Paternal Aunt 5   Lung cancer Paternal Uncle 64   Breast cancer Paternal Aunt        ? age    Allergies  Allergen Reactions   Erythromycin Nausea And Vomiting   Naproxen Other (See Comments)    Makes stool like coffee grinds     Review of Systems  All other systems reviewed and are negative.      Objective:   BP 104/68   Pulse 91   Ht 5' 5 (1.651 m)   Wt 137 lb 9.6 oz (62.4 kg)   SpO2 98%   BMI 22.90 kg/m   Vitals:   04/13/24 1320  BP: 104/68  Pulse: 91  Height: 5' 5 (1.651 m)  Weight: 137 lb 9.6 oz (62.4 kg)  SpO2:  98%  BMI (Calculated): 22.9    Physical Exam Vitals and nursing note reviewed.  Constitutional:      Appearance: Normal appearance. She is normal weight.  HENT:     Head: Normocephalic.  Eyes:     Extraocular Movements: Extraocular movements intact.     Conjunctiva/sclera: Conjunctivae normal.     Pupils: Pupils are equal, round, and reactive to light.  Cardiovascular:     Rate and Rhythm: Normal rate.  Pulmonary:     Effort: Pulmonary effort is normal.  Neurological:     General: No focal deficit present.     Mental Status: She is alert and oriented to person, place, and time. Mental status is at baseline.  Psychiatric:        Mood and Affect: Mood normal.        Behavior: Behavior normal.        Thought Content: Thought content normal.        Judgment: Judgment normal.      No results found for any visits on 04/13/24.  Recent Results (from the past 2160 hours)  Lipid panel     Status: Abnormal   Collection Time: 03/30/24  3:20 PM  Result Value Ref Range   Cholesterol, Total 201 (H) 100 - 199 mg/dL    Triglycerides 54 0 - 149 mg/dL   HDL 879 >60 mg/dL   VLDL Cholesterol Cal 10 5 - 40 mg/dL   LDL Chol Calc (NIH) 71 0 - 99 mg/dL   Chol/HDL Ratio 1.7 0.0 - 4.4 ratio    Comment:                                   T. Chol/HDL Ratio                                             Men  Women                               1/2 Avg.Risk  3.4    3.3                                   Avg.Risk  5.0    4.4                                2X Avg.Risk  9.6    7.1                                3X Avg.Risk 23.4   11.0   VITAMIN D  25 Hydroxy (Vit-D Deficiency, Fractures)     Status: Abnormal   Collection Time: 03/30/24  3:20 PM  Result Value Ref Range   Vit D, 25-Hydroxy 24.8 (L) 30.0 - 100.0 ng/mL    Comment: Vitamin D  deficiency has been defined by the Institute of Medicine and an Endocrine Society practice guideline as a level of serum 25-OH vitamin D  less than 20 ng/mL (1,2). The Endocrine Society went on to further define vitamin D  insufficiency as a level between 21 and 29  ng/mL (2). 1. IOM (Institute of Medicine). 2010. Dietary reference    intakes for calcium and D. [place]: The    [employer]. 2. Holick MF, Binkley [place], Bischoff-Ferrari HA, et al.    Evaluation, treatment, and prevention of vitamin D     deficiency: an Endocrine Society clinical practice    guideline. JCEM. 2011 Jul; 96(7):1911-30.   CMP14+EGFR     Status: None   Collection Time: 03/30/24  3:20 PM  Result Value Ref Range   Glucose 91 70 - 99 mg/dL   BUN 12 6 - 24 mg/dL   Creatinine, Ser 9.36 0.57 - 1.00 mg/dL   eGFR 890 >40 fO/fpw/8.26   BUN/Creatinine Ratio 19 9 - 23   Sodium 138 134 - 144 mmol/L   Potassium 4.3 3.5 - 5.2 mmol/L   Chloride 99 96 - 106 mmol/L   CO2 25 20 - 29 mmol/L   Calcium 9.6 8.7 - 10.2 mg/dL   Total Protein 7.5 6.0 - 8.5 g/dL   Albumin 4.4 3.9 - 4.9 g/dL   Globulin, Total 3.1 1.5 - 4.5 g/dL   Bilirubin Total 0.5 0.0 - 1.2 mg/dL   Alkaline Phosphatase 102 44 - 121 IU/L     Comment: **Effective April 09, 2024 Alkaline Phosphatase**   reference interval will be changing to:              Age                Female          Female           0 -  5 days         47 - 127       47 - 127           6 - 10 days         29 - 242       29 - 242          11 - 20 days        109 - 357      109 - 357          21 - 30 days         94 - 494       94 - 494           1 -  2 months      149 - 539      149 - 539           3 -  6 months      131 - 452      131 - 452           7 - 11 months      117 - 401      117 - 401   12 months -  6 years       158 - 369      158 - 369           7 - 12 years       150 - 409      150 - 409               13 years       156 - 435       78 - 227               14 years  114 - 375       64 - 161               15 years        88 - 279       56 - 134               16 years        74 - 207       51 - 121               17 years        63 - 161       47 - 113          18 - 20 years        51 - 125       42 - 106          21 - 50 years         47 - 123       41 - 116          51 - 80 years        49 - 135       51 - 125              >80 years        48 - 129       48 - 129    AST 22 0 - 40 IU/L   ALT 18 0 - 32 IU/L  TSH     Status: None   Collection Time: 03/30/24  3:20 PM  Result Value Ref Range   TSH 2.010 0.450 - 4.500 uIU/mL  Hemoglobin A1c     Status: None   Collection Time: 03/30/24  3:20 PM  Result Value Ref Range   Hgb A1c MFr Bld 5.0 4.8 - 5.6 %    Comment:          Prediabetes: 5.7 - 6.4          Diabetes: >6.4          Glycemic control for adults with diabetes: <7.0    Est. average glucose Bld gHb Est-mCnc 97 mg/dL  Vitamin A87     Status: None   Collection Time: 03/30/24  3:20 PM  Result Value Ref Range   Vitamin B-12 306 232 - 1,245 pg/mL  CBC with Diff     Status: Abnormal   Collection Time: 03/30/24  3:20 PM  Result Value Ref Range   WBC 7.4 3.4 - 10.8 x10E3/uL   RBC 3.72 (L) 3.77 - 5.28 x10E6/uL   Hemoglobin 12.4  11.1 - 15.9 g/dL   Hematocrit 62.7 65.9 - 46.6 %   MCV 100 (H) 79 - 97 fL   MCH 33.3 (H) 26.6 - 33.0 pg   MCHC 33.3 31.5 - 35.7 g/dL   RDW 87.9 88.2 - 84.5 %   Platelets 302 150 - 450 x10E3/uL   Neutrophils 66 Not Estab. %   Lymphs 22 Not Estab. %   Monocytes 9 Not Estab. %   Eos 3 Not Estab. %   Basos 0 Not Estab. %   Neutrophils Absolute 4.8 1.4 - 7.0 x10E3/uL   Lymphocytes Absolute 1.6 0.7 - 3.1 x10E3/uL   Monocytes Absolute 0.7 0.1 - 0.9 x10E3/uL   EOS (ABSOLUTE) 0.2 0.0 - 0.4 x10E3/uL   Basophils Absolute 0.0 0.0 - 0.2 x10E3/uL   Immature Granulocytes 0 Not Estab. %  Immature Grans (Abs) 0.0 0.0 - 0.1 x10E3/uL       Assessment & Plan Hard palate ulcer Referral has been sent to [facility], patient given contact information.   Will defer to them for further treatment.   Chronic post-traumatic stress disorder (PTSD) Panic disorder Patient stable.  Well controlled with current therapy.   Continue current meds.   B12 deficiency due to diet Vitamin D  deficiency, unspecified Vitamin D  and B12 both low on labwork.  Sending RX for pt for both.  She will come in monthly for B12 shots.   Chronic obstructive pulmonary disease, unspecified COPD type (HCC) Patient stable.  Well controlled with current therapy.   Continue current meds.      Return in about 3 months (around 07/13/2024).   Total time spent: 20 minutes  ALAN CHRISTELLA ARRANT, FNP  04/13/2024   This document may have been prepared by [facility] Voice Recognition software and as such may include unintentional dictation errors.

## 2024-04-17 LAB — ANA 12 PROFILE, DO ALL RDL
Anti-Cardiolipin Ab, IgA (RDL): <12 U/mL (ref <12)
Anti-Cardiolipin Ab, IgG (RDL): <15 GPL U/mL (ref <15)
Anti-Cardiolipin Ab, IgM (RDL): <13 [MPL'U]/mL (ref <13)
Anti-Centromere Ab (RDL): <1:40 {titer}
Anti-La (SS-B) Ab (RDL): <20 U (ref <20)
Anti-Nuclear Ab by IFA (RDL): POSITIVE — AB
Anti-Ro (SS-A) Ab (RDL): <20 U (ref <20)
Anti-Scl-70 Ab (RDL): <20 U (ref <20)
Anti-Sm Ab (RDL): <20 U (ref <20)
Anti-TPO Ab (RDL): <9 [IU]/mL (ref <9.0)
Anti-U1 RNP Ab (RDL): <20 U (ref <20)
Anti-dsDNA Ab by Farr(RDL): <8 [IU]/mL (ref <8.0)
C3 Complement (RDL): 167 mg/dL (ref 90–180)
C4 Complement (RDL): 21 mg/dL (ref 10–40)

## 2024-04-17 LAB — ANA TITER AND PATTERN: Speckled Pattern: 1:160 {titer} — ABNORMAL HIGH

## 2024-04-18 ENCOUNTER — Ambulatory Visit (INDEPENDENT_AMBULATORY_CARE_PROVIDER_SITE_OTHER): Admitting: Family

## 2024-04-18 DIAGNOSIS — E538 Deficiency of other specified B group vitamins: Secondary | ICD-10-CM

## 2024-04-18 MED ORDER — CYANOCOBALAMIN 1000 MCG/ML IJ SOLN
1000.0000 ug | Freq: Once | INTRAMUSCULAR | Status: AC
  Administered 2024-04-18: 1000 ug via INTRAMUSCULAR

## 2024-04-19 NOTE — Progress Notes (Signed)
   CHIEF COMPLAINT  B12 Shot     REASON FOR VISIT  B12 Injection     ASSESSMENT  B12 Deficiency, Unspecified     PLAN  Diagnoses and all orders for this visit:  B12 deficiency due to diet -     cyanocobalamin  (VITAMIN B12) injection 1,000 mcg     Pt. given B12 injection in clinic.  Return for next injection per provider instructions.   Total time spent: 5 minutes  ALAN CHRISTELLA ARRANT, FNP  04/18/2024

## 2024-05-09 ENCOUNTER — Telehealth: Payer: Self-pay | Admitting: Family

## 2024-05-09 NOTE — Telephone Encounter (Signed)
 Patient left VM wanting a call back to discuss about a referral Alan put in for her to see a doctor in [place]. They are sending her to pathology and she wants to discuss this with us .

## 2024-05-11 NOTE — Telephone Encounter (Signed)
 Has appt on 10/20, she can discuss this with Alan then

## 2024-05-14 ENCOUNTER — Ambulatory Visit: Admitting: Family

## 2024-05-14 ENCOUNTER — Encounter: Payer: Self-pay | Admitting: Family

## 2024-05-14 VITALS — BP 128/86 | HR 107 | Ht 65.0 in | Wt 140.2 lb

## 2024-05-14 DIAGNOSIS — Z131 Encounter for screening for diabetes mellitus: Secondary | ICD-10-CM

## 2024-05-14 DIAGNOSIS — Z23 Encounter for immunization: Secondary | ICD-10-CM | POA: Diagnosis not present

## 2024-05-14 DIAGNOSIS — F410 Panic disorder [episodic paroxysmal anxiety] without agoraphobia: Secondary | ICD-10-CM

## 2024-05-14 DIAGNOSIS — J449 Chronic obstructive pulmonary disease, unspecified: Secondary | ICD-10-CM | POA: Diagnosis not present

## 2024-05-14 DIAGNOSIS — F4312 Post-traumatic stress disorder, chronic: Secondary | ICD-10-CM

## 2024-05-14 DIAGNOSIS — K121 Other forms of stomatitis: Secondary | ICD-10-CM | POA: Diagnosis not present

## 2024-05-14 DIAGNOSIS — Z0130 Encounter for examination of blood pressure without abnormal findings: Secondary | ICD-10-CM

## 2024-05-14 DIAGNOSIS — E538 Deficiency of other specified B group vitamins: Secondary | ICD-10-CM

## 2024-05-14 MED ORDER — CYANOCOBALAMIN 1000 MCG/ML IJ SOLN: 1000.0000 ug | INTRAMUSCULAR | 3 refills | Status: AC

## 2024-05-14 MED ORDER — ALBUTEROL SULFATE HFA 108 (90 BASE) MCG/ACT IN AERS: 2.0000 | INHALATION_SPRAY | Freq: Four times a day (QID) | RESPIRATORY_TRACT | 3 refills | Status: AC | PRN

## 2024-05-14 MED ORDER — ALPRAZOLAM 0.5 MG PO TABS: 0.5000 mg | ORAL_TABLET | Freq: Three times a day (TID) | ORAL | 2 refills | Status: DC | PRN

## 2024-05-14 MED ORDER — LIDOCAINE 5 % EX PTCH: 1.0000 | MEDICATED_PATCH | CUTANEOUS | 3 refills | Status: AC

## 2024-05-14 NOTE — Assessment & Plan Note (Signed)
-   Alprazolam  refill: prescribed for anxiety management

## 2024-05-14 NOTE — Assessment & Plan Note (Signed)
-   Albuterol inhaler: prescribed for respiratory symptoms

## 2024-05-14 NOTE — Assessment & Plan Note (Signed)
-   Dental referral follow-up: patient scheduled with appropriate specialist for palate reconstruction evaluation - Provider offered to communicate with dental specialist regarding patient's case history if consultation needed

## 2024-05-14 NOTE — Assessment & Plan Note (Signed)
-   Vitamin B12 injection: prescribed 83-month supply for patient to bring for administration

## 2024-05-14 NOTE — Progress Notes (Signed)
 Established Patient Office Visit  Subjective:  Patient ID: Joan Bass, female    DOB: 04/01/1976  Age: 48 y.o. MRN: [id number]  Chief Complaint  Patient presents with   Follow-up    1 month follow up    Pt presented for routine care and received influenza vaccination. She has an upcoming dental appointment that was previously scheduled through ENT referral. The dental appointment represents appropriate referral for palate reconstruction evaluation, which is the intended specialty consultation for her ongoing oral health concerns.  She requested refill of alprazolam , stating she becomes very anxious without this medication.  She also requested B12 vitamin injection refill and an albuterol inhaler for respiratory symptoms and has documented history of albuterol use.  Pt reported feeling stressed about her ongoing dental/oral health situation but is otherwise managing well.  She expressed frustration with the referral process and the initial ENT consultation experience but understands the need to proceed with appropriate specialty care.    No other concerns at this time.   Past Medical History:  Diagnosis Date   Anxiety    Asthma    COPD (chronic obstructive pulmonary disease) (HCC)    Depression    GERD (gastroesophageal reflux disease)    Headache    migraines   Shortness of breath dyspnea    Sleep apnea     Past Surgical History:  Procedure Laterality Date   ABDOMINAL HYSTERECTOMY     BREAST BIOPSY Right 05/21/2015   Fibroadenoma   COLONOSCOPY WITH PROPOFOL  N/A 12/13/2014   Procedure: COLONOSCOPY WITH PROPOFOL ;  Surgeon: Deward CINDERELLA Piedmont, MD;  Location: [facility] ENDOSCOPY;  Service: Gastroenterology;  Laterality: N/A;   ESOPHAGOGASTRODUODENOSCOPY N/A 12/13/2014   Procedure: ESOPHAGOGASTRODUODENOSCOPY (EGD);  Surgeon: Deward CINDERELLA Piedmont, MD;  Location: [facility] ENDOSCOPY;  Service: Gastroenterology;  Laterality: N/A;   left elbow     left eye and cheek     TUBAL LIGATION  1999    Social  History   Socioeconomic History   Marital status: Single    Spouse name: Not on file   Number of children: Not on file   Years of education: Not on file   Highest education level: Not on file  Occupational History   Not on file  Tobacco Use   Smoking status: Every Day    Current packs/day: 0.50    Average packs/day: 0.5 packs/day for 26.0 years (13.0 ttl pk-yrs)    Types: Cigarettes   Smokeless tobacco: Never  Substance and Sexual Activity   Alcohol use: No   Drug use: No   Sexual activity: Not on file  Other Topics Concern   Not on file  Social History Narrative   Not on file   Social Drivers of Health   Financial Resource Strain: Not on file  Food Insecurity: Not on file  Transportation Needs: Not on file  Physical Activity: Not on file  Stress: Not on file  Social Connections: Not on file  Intimate Partner Violence: Not on file    Family History  Problem Relation Age of Onset   Breast cancer Paternal Aunt 65   Lung cancer Paternal Uncle 5   Breast cancer Paternal Aunt        ? age    Allergies  Allergen Reactions   Naproxen Other (See Comments) and Nausea And Vomiting    Makes stool like coffee grinds  Makes stool like coffee grinds     Makes stools look like coffee grounds.   Erythromycin Nausea And Vomiting  Review of Systems  All other systems reviewed and are negative.      Objective:   BP 128/86   Pulse (!) 107   Ht 5' 5 (1.651 m)   Wt 140 lb 3.2 oz (63.6 kg)   SpO2 98%   BMI 23.33 kg/m   Vitals:   05/14/24 1138  BP: 128/86  Pulse: (!) 107  Height: 5' 5 (1.651 m)  Weight: 140 lb 3.2 oz (63.6 kg)  SpO2: 98%  BMI (Calculated): 23.33    Physical Exam Vitals and nursing note reviewed.  Constitutional:      Appearance: Normal appearance. She is normal weight.  HENT:     Head: Normocephalic.  Eyes:     Extraocular Movements: Extraocular movements intact.     Conjunctiva/sclera: Conjunctivae normal.     Pupils: Pupils are  equal, round, and reactive to light.  Cardiovascular:     Rate and Rhythm: Normal rate.  Pulmonary:     Effort: Pulmonary effort is normal.  Neurological:     General: No focal deficit present.     Mental Status: She is alert and oriented to person, place, and time. Mental status is at baseline.  Psychiatric:        Mood and Affect: Mood normal.        Behavior: Behavior normal.        Thought Content: Thought content normal.      No results found for any visits on 05/14/24.  Recent Results (from the past 2160 hours)  ANA 12 Profile, Do All RDL     Status: Abnormal   Collection Time: 03/30/24  3:17 PM  Result Value Ref Range   Anti-Nuclear Ab by IFA (RDL) Positive (A) Negative   Anti-Centromere Ab (RDL) <1:40 <1:40   Anti-dsDNA Ab by Farr(RDL) <8.0 <8.0 IU/mL   Anti-Sm Ab (RDL) <20 <20 Units   Anti-U1 RNP Ab (RDL) <20 <20 Units   Anti-Ro (SS-A) Ab (RDL) <20 <20 Units   Anti-La (SS-B) Ab (RDL) <20 <20 Units   Anti-Scl-70 Ab (RDL) <20 <20 Units   Anti-Cardiolipin Ab, IgG (RDL) <15 <15 GPL U/mL   Anti-Cardiolipin Ab, IgA (RDL) <12 <12 APL U/mL   Anti-Cardiolipin Ab, IgM (RDL) <13 <13 MPL U/mL   C3 Complement (RDL) 167 90 - 180 mg/dL   C4 Complement (RDL) 21 10 - 40 mg/dL    Comment:               **Please note reference interval change**   Anti-TPO Ab (RDL) <9.0 <9.0 IU/mL    Comment:    Interpretation for Anti-Sm, Anti-U1 RNP, Anti-Ro,     Anti-La:         Negative:                                <20         Weak Positive:                       20 - 39         Moderate Positive:                   40 - 80         Strong Positive:                         >80  Strong Positive:                         >59    Interpretation for Anti-Cardiolipin Ab:     Negative:               GPL <15, APL <12, MPL <13     Indeterminate:    GPL 15-20, APL 12-20, MPL 13-20     Low Positive:           GPL, APL, MPL    >20 - 40     Med Positive:           GPL, APL, MPL    >40 - 80      High Positive:          GPL, APL, MPL         >80 SLE classification criteria are based on Med to High titer (>40) Anti-Cardiolipin Ab (aCL). aCL may be elevated transiently with certain infections and may increase spuriously in the presence of rheumatoid factor.   ANA Titer and Pattern     Status: Abnormal   Collection Time: 03/30/24  3:17 PM  Result Value Ref Range   Speckled Pattern 1:160 (H) <1:40   Note: Comment     Comment: ANA performed by Indirect Fluorescent Antibody (IFA)  Lipid panel     Status: Abnormal   Collection Time: 03/30/24  3:20 PM  Result Value Ref Range   Cholesterol, Total 201 (H) 100 - 199 mg/dL   Triglycerides 54 0 - 149 mg/dL   HDL 879 >60 mg/dL   VLDL Cholesterol Cal 10 5 - 40 mg/dL   LDL Chol Calc (NIH) 71 0 - 99 mg/dL   Chol/HDL Ratio 1.7 0.0 - 4.4 ratio    Comment:                                   T. Chol/HDL Ratio                                             Men  Women                               1/2 Avg.Risk  3.4    3.3                                   Avg.Risk  5.0    4.4                                2X Avg.Risk  9.6    7.1                                3X Avg.Risk 23.4   11.0   VITAMIN D  25 Hydroxy (Vit-D Deficiency, Fractures)     Status: Abnormal   Collection Time: 03/30/24  3:20 PM  Result Value Ref Range   Vit D, 25-Hydroxy 24.8 (L) 30.0 - 100.0 ng/mL    Comment: Vitamin D  deficiency has been defined by  the Institute of Medicine and an Endocrine Society practice guideline as a level of serum 25-OH vitamin D  less than 20 ng/mL (1,2). The Endocrine Society went on to further define vitamin D  insufficiency as a level between 21 and 29 ng/mL (2). 1. IOM (Institute of Medicine). 2010. Dietary reference    intakes for calcium and D. [place]: The    [employer]. 2. Holick MF, Binkley [place], Bischoff-Ferrari HA, et al.    Evaluation, treatment, and prevention of vitamin D     deficiency: an Endocrine Society clinical  practice    guideline. JCEM. 2011 Jul; 96(7):1911-30.   CMP14+EGFR     Status: None   Collection Time: 03/30/24  3:20 PM  Result Value Ref Range   Glucose 91 70 - 99 mg/dL   BUN 12 6 - 24 mg/dL   Creatinine, Ser 9.36 0.57 - 1.00 mg/dL   eGFR 890 >40 fO/fpw/8.26   BUN/Creatinine Ratio 19 9 - 23   Sodium 138 134 - 144 mmol/L   Potassium 4.3 3.5 - 5.2 mmol/L   Chloride 99 96 - 106 mmol/L   CO2 25 20 - 29 mmol/L   Calcium 9.6 8.7 - 10.2 mg/dL   Total Protein 7.5 6.0 - 8.5 g/dL   Albumin 4.4 3.9 - 4.9 g/dL   Globulin, Total 3.1 1.5 - 4.5 g/dL   Bilirubin Total 0.5 0.0 - 1.2 mg/dL   Alkaline Phosphatase 102 44 - 121 IU/L    Comment: **Effective April 09, 2024 Alkaline Phosphatase**   reference interval will be changing to:              Age                Female          Female           0 -  5 days         47 - 127       47 - 127           6 - 10 days         29 - 242       29 - 242          11 - 20 days        109 - 357      109 - 357          21 - 30 days         94 - 494       94 - 494           1 -  2 months      149 - 539      149 - 539           3 -  6 months      131 - 452      131 - 452           7 - 11 months      117 - 401      117 - 401   12 months -  6 years       158 - 369      158 - 369           7 - 12 years       150 - 409      150 - 409  13 years       156 - 435       78 - 227               14 years       114 - 375       64 - 161               15 years        88 - 279       56 - 134               16 years        74 - 207       51 - 121               17 years        63 - 161       47 - 113          18 - 20 years        51 - 125       42 - 106          21 - 50 years         47 - 123       41 - 116          51 - 80 years        49 - 135       51 - 125              >80 years        48 - 129       48 - 129    AST 22 0 - 40 IU/L   ALT 18 0 - 32 IU/L  TSH     Status: None   Collection Time: 03/30/24  3:20 PM  Result Value Ref Range   TSH 2.010 0.450  - 4.500 uIU/mL  Hemoglobin A1c     Status: None   Collection Time: 03/30/24  3:20 PM  Result Value Ref Range   Hgb A1c MFr Bld 5.0 4.8 - 5.6 %    Comment:          Prediabetes: 5.7 - 6.4          Diabetes: >6.4          Glycemic control for adults with diabetes: <7.0    Est. average glucose Bld gHb Est-mCnc 97 mg/dL  Vitamin A87     Status: None   Collection Time: 03/30/24  3:20 PM  Result Value Ref Range   Vitamin B-12 306 232 - 1,245 pg/mL  CBC with Diff     Status: Abnormal   Collection Time: 03/30/24  3:20 PM  Result Value Ref Range   WBC 7.4 3.4 - 10.8 x10E3/uL   RBC 3.72 (L) 3.77 - 5.28 x10E6/uL   Hemoglobin 12.4 11.1 - 15.9 g/dL   Hematocrit 62.7 65.9 - 46.6 %   MCV 100 (H) 79 - 97 fL   MCH 33.3 (H) 26.6 - 33.0 pg   MCHC 33.3 31.5 - 35.7 g/dL   RDW 87.9 88.2 - 84.5 %   Platelets 302 150 - 450 x10E3/uL   Neutrophils 66 Not Estab. %   Lymphs 22 Not Estab. %   Monocytes 9 Not Estab. %   Eos 3 Not Estab. %   Basos 0 Not Estab. %   Neutrophils Absolute 4.8 1.4 - 7.0 x10E3/uL  Lymphocytes Absolute 1.6 0.7 - 3.1 x10E3/uL   Monocytes Absolute 0.7 0.1 - 0.9 x10E3/uL   EOS (ABSOLUTE) 0.2 0.0 - 0.4 x10E3/uL   Basophils Absolute 0.0 0.0 - 0.2 x10E3/uL   Immature Granulocytes 0 Not Estab. %   Immature Grans (Abs) 0.0 0.0 - 0.1 x10E3/uL       Assessment & Plan Chronic post-traumatic stress disorder (PTSD) Panic disorder - Alprazolam  refill: prescribed for anxiety management  Chronic obstructive pulmonary disease, unspecified COPD type (HCC) - Albuterol inhaler: prescribed for respiratory symptoms  Hard palate ulcer - Dental referral follow-up: patient scheduled with appropriate specialist for palate reconstruction evaluation - Provider offered to communicate with dental specialist regarding patient's case history if consultation needed  B12 deficiency due to diet - Vitamin B12 injection: prescribed 72-month supply for patient to bring for administration  Flu vaccine  need - Influenza vaccination: administered during visit    Return in about 3 months (around 08/14/2024).   Total time spent: 20 minutes  ALAN CHRISTELLA ARRANT, FNP  05/14/2024   This document may have been prepared by [facility] Voice Recognition software and as such may include unintentional dictation errors.

## 2024-06-11 ENCOUNTER — Other Ambulatory Visit: Payer: Self-pay | Admitting: Family

## 2024-06-11 DIAGNOSIS — F410 Panic disorder [episodic paroxysmal anxiety] without agoraphobia: Secondary | ICD-10-CM

## 2024-06-11 DIAGNOSIS — F4312 Post-traumatic stress disorder, chronic: Secondary | ICD-10-CM

## 2024-06-22 ENCOUNTER — Other Ambulatory Visit: Payer: Self-pay

## 2024-06-22 DIAGNOSIS — F410 Panic disorder [episodic paroxysmal anxiety] without agoraphobia: Secondary | ICD-10-CM

## 2024-06-22 DIAGNOSIS — F4312 Post-traumatic stress disorder, chronic: Secondary | ICD-10-CM

## 2024-06-28 ENCOUNTER — Other Ambulatory Visit: Payer: Self-pay | Admitting: Family

## 2024-06-28 DIAGNOSIS — F4312 Post-traumatic stress disorder, chronic: Secondary | ICD-10-CM

## 2024-06-28 DIAGNOSIS — F410 Panic disorder [episodic paroxysmal anxiety] without agoraphobia: Secondary | ICD-10-CM

## 2024-06-28 MED ORDER — ALPRAZOLAM 0.5 MG PO TABS: 0.5000 mg | ORAL_TABLET | Freq: Three times a day (TID) | ORAL | 0 refills | Status: AC | PRN

## 2024-07-09 ENCOUNTER — Other Ambulatory Visit: Payer: Self-pay

## 2024-07-09 DIAGNOSIS — F410 Panic disorder [episodic paroxysmal anxiety] without agoraphobia: Secondary | ICD-10-CM

## 2024-07-09 DIAGNOSIS — F4312 Post-traumatic stress disorder, chronic: Secondary | ICD-10-CM

## 2024-07-10 ENCOUNTER — Other Ambulatory Visit: Payer: Self-pay | Admitting: Family

## 2024-07-10 DIAGNOSIS — F4312 Post-traumatic stress disorder, chronic: Secondary | ICD-10-CM

## 2024-07-10 DIAGNOSIS — F410 Panic disorder [episodic paroxysmal anxiety] without agoraphobia: Secondary | ICD-10-CM

## 2024-07-23 ENCOUNTER — Other Ambulatory Visit: Payer: Self-pay

## 2024-07-23 ENCOUNTER — Other Ambulatory Visit: Payer: Self-pay | Admitting: Family

## 2024-07-23 DIAGNOSIS — F410 Panic disorder [episodic paroxysmal anxiety] without agoraphobia: Secondary | ICD-10-CM

## 2024-07-23 DIAGNOSIS — F4312 Post-traumatic stress disorder, chronic: Secondary | ICD-10-CM

## 2024-07-24 ENCOUNTER — Other Ambulatory Visit: Payer: Self-pay

## 2024-08-06 ENCOUNTER — Other Ambulatory Visit: Payer: Self-pay | Admitting: Family

## 2024-08-06 DIAGNOSIS — F410 Panic disorder [episodic paroxysmal anxiety] without agoraphobia: Secondary | ICD-10-CM

## 2024-08-06 DIAGNOSIS — F4312 Post-traumatic stress disorder, chronic: Secondary | ICD-10-CM

## 2024-08-21 ENCOUNTER — Ambulatory Visit: Admitting: Family

## 2024-09-04 ENCOUNTER — Ambulatory Visit: Admitting: Family
